# Patient Record
Sex: Female | Born: 1955 | Race: Black or African American | Hispanic: No | Smoking: Former smoker
Health system: Southern US, Community
[De-identification: ages and names within clinical notes are randomized; demographics above are authoritative.]

## PROBLEM LIST (undated history)

## (undated) DIAGNOSIS — G43909 Migraine, unspecified, not intractable, without status migrainosus: Secondary | ICD-10-CM

## (undated) DIAGNOSIS — I10 Essential (primary) hypertension: Secondary | ICD-10-CM

## (undated) DIAGNOSIS — M1990 Unspecified osteoarthritis, unspecified site: Secondary | ICD-10-CM

## (undated) DIAGNOSIS — E785 Hyperlipidemia, unspecified: Secondary | ICD-10-CM

## (undated) DIAGNOSIS — M109 Gout, unspecified: Secondary | ICD-10-CM

## (undated) DIAGNOSIS — F419 Anxiety disorder, unspecified: Secondary | ICD-10-CM

## (undated) DIAGNOSIS — E039 Hypothyroidism, unspecified: Secondary | ICD-10-CM

## (undated) DIAGNOSIS — D219 Benign neoplasm of connective and other soft tissue, unspecified: Secondary | ICD-10-CM

## (undated) DIAGNOSIS — K5900 Constipation, unspecified: Secondary | ICD-10-CM

## (undated) DIAGNOSIS — Z972 Presence of dental prosthetic device (complete) (partial): Secondary | ICD-10-CM

## (undated) DIAGNOSIS — K649 Unspecified hemorrhoids: Secondary | ICD-10-CM

## (undated) DIAGNOSIS — M719 Bursopathy, unspecified: Secondary | ICD-10-CM

## (undated) HISTORY — DX: Migraine, unspecified, not intractable, without status migrainosus: G43.909

## (undated) HISTORY — DX: Hyperlipidemia, unspecified: E78.5

## (undated) HISTORY — DX: Hypothyroidism, unspecified: E03.9

## (undated) HISTORY — DX: Gout, unspecified: M10.9

## (undated) HISTORY — PX: DG TEETH FULL: HXRAD118

## (undated) HISTORY — DX: Essential (primary) hypertension: I10

## (undated) HISTORY — DX: Unspecified hemorrhoids: K64.9

## (undated) HISTORY — DX: Unspecified osteoarthritis, unspecified site: M19.90

## (undated) HISTORY — PX: OTHER SURGICAL HISTORY: SHX169

## (undated) HISTORY — DX: Benign neoplasm of connective and other soft tissue, unspecified: D21.9

## (undated) HISTORY — DX: Constipation, unspecified: K59.00

---

## 2010-07-30 HISTORY — PX: LAPAROSCOPIC SUPRACERVICAL HYSTERECTOMY: SUR797

## 2012-01-29 ENCOUNTER — Ambulatory Visit: Payer: Self-pay | Admitting: Obstetrics and Gynecology

## 2012-01-29 LAB — COMPREHENSIVE METABOLIC PANEL
Anion Gap: 4 — ABNORMAL LOW (ref 7–16)
BUN: 10 mg/dL (ref 7–18)
Bilirubin,Total: 0.5 mg/dL (ref 0.2–1.0)
Calcium, Total: 8.9 mg/dL (ref 8.5–10.1)
Chloride: 107 mmol/L (ref 98–107)
Co2: 30 mmol/L (ref 21–32)
EGFR (African American): >60
Potassium: 3.8 mmol/L (ref 3.5–5.1)
SGOT(AST): 28 U/L (ref 15–37)
Total Protein: 8 g/dL (ref 6.4–8.2)

## 2012-01-29 LAB — CBC
HCT: 45.8 % (ref 35.0–47.0)
HGB: 15.1 g/dL (ref 12.0–16.0)
MCHC: 33 g/dL (ref 32.0–36.0)
MCV: 84 fL (ref 80–100)
RBC: 5.47 10*6/uL — ABNORMAL HIGH (ref 3.80–5.20)
RDW: 15.1 % — ABNORMAL HIGH (ref 11.5–14.5)

## 2012-02-08 ENCOUNTER — Ambulatory Visit: Payer: Self-pay | Admitting: Obstetrics and Gynecology

## 2012-02-09 LAB — HEMATOCRIT: HCT: 42.4 % (ref 35.0–47.0)

## 2012-02-12 LAB — PATHOLOGY REPORT

## 2012-07-21 ENCOUNTER — Ambulatory Visit: Payer: Self-pay | Admitting: Internal Medicine

## 2013-07-27 ENCOUNTER — Ambulatory Visit: Payer: Self-pay | Admitting: Nurse Practitioner

## 2014-08-03 ENCOUNTER — Ambulatory Visit: Payer: Self-pay | Admitting: Nurse Practitioner

## 2014-11-21 NOTE — Op Note (Signed)
PATIENT NAME:  Kristin, Greer MR#:  [id number] DATE OF BIRTH:  17-Apr-1956  DATE OF PROCEDURE:  02/08/2012  PREOPERATIVE DIAGNOSIS: Leiomyomatous uterus.   POSTOPERATIVE DIAGNOSIS: Leiomyomatous uterus.   PROCEDURE: Laparoscopic supracervical hysterectomy.   SURGEON: Will Bonnet, MD  ASSISTANT: Donzetta Matters, M.D.    ANESTHESIA: General.  ESTIMATED BLOOD LOSS: 50 mL.  OPERATIVE FLUIDS: 1200 mL crystalloid.   COMPLICATIONS: None.   FINDINGS:  1. Small uterus with multiple fibroids noted.  2. Absent bilateral fallopian tubes.  3. Normal appearing appendix.   SPECIMENS: Uterus without cervix.   CONDITION: Stable at the end of the procedure.   INDICATIONS FOR PROCEDURE: Kristin Greer is a 59 year old female who presented to the clinic with a long history of abdominal pain and pressure. Upon workup with a CT scan she did have noted multiple fibroids in her uterus. She presented to my clinic with a strong desire to have her uterus removed. After she was counseled regarding the potential for removing her uterus and having no relief from her pain she still readily agreed to the procedure. Prior to surgery she received preoperative clearance from her primary care physician. She was therefore taken to the Operating Room.   PROCEDURE IN DETAIL: The patient was met in the preoperative area and all of her questions were answered and the consent was reviewed. Next, she was taken to the Operating Room where she was placed under general anesthesia which was found to be adequate. She was placed in the dorsal supine lithotomy position and prepped and draped in the usual sterile fashion.   After a timeout was called, a sterile speculum was placed in the vagina and the Hulka tenaculum was affixed to the cervix and the speculum was removed. A Foley catheter was placed indwelling to the patient's bladder.   Attention was turned to the abdomen where a 5 mm infraumbilical incision was made after  injection of local anesthetic. The abdomen was entered using Opti-view trocar direct visualization method. Verification of abdominal entry was obtained using opening pressure. The abdomen was then insufflated with CO2 and the camera was introduced through the umbilical trocar and abdominal atraumatic entry was verified. A 10 mm left lower quadrant and 5 mm right lower quadrant ports were placed under direct intra-abdominal visualization. An abdominal surveillance was then undertaken with the above-noted findings.   The left round ligament was then transected using the Harmonic scalpel and the broad ligament was then dissected down to the level of the internal os and a bladder flap was created. The mesosalpinx was transected from lateral to medial and the utero-ovarian ligament was transected such that the Fallopian tubes would be removed and the ovary would be left in situ.  The posterior broad ligament was then transected down to the level of the internal os. The uterine arteries were then skeletonized and then cauterized using the bipolar electrocautery and then transected using the Harmonic scalpel. The same procedure was carried out on the right side of the uterus. Next, the Harmonic scalpel was used to transect the uterus from the cervix at the level of the internal os and the Hulka tenaculum was removed during this process. Once the uterus was liberalized from the cervix the bipolar electrocautery was used to obtain hemostasis at the cervical stump and the bipolar electrocautery was used to cauterize the endocervix.   The right lower quadrant port was replaced with the Gynecare morcellator port and then under direct visualization the Gynecare morcellator was then used  to morcellate the uterus and remove it entirely from the abdomen taking care to remove any pieces of uterus still remaining.    After verification that the uterus was removed the pedicles along with the cervical stump were then examined and  hemostasis was noted still. An antiadhesion barrier device was placed over the cervical stump. At this point the procedure was terminated. The abdomen was desufflated of CO2. Both lateral port sites removed under direct visualization and after the right lower quadrant port was closed using #0 Vicryl at the level of the fascia. The infraumbilical and the left lower quadrant port sites were closed using a subcuticular 3-0 Vicryl with Dermabond to close the skin. The right lower quadrant skin was closed using a 3-0 Vicryl in a subcuticular fashion and skin was reapproximated using Dermabond. Each site was injected with additional local anesthetic for postoperative pain control.   The cervix was inspected to verify that hemostasis was present after removal of Hulka clip and the vagina was inspected to verify that there were no sponges or instruments remaining.   Patient tolerated the procedure well. Sponge, lap, and needle counts were correct x2. The patient received preoperative antibiotics in the form of Ancef 2 grams. She received VTE prophylaxis in the form of pneumatic compression stockings that were in place and operating throughout the procedure.   ____________________________ Will Bonnet, MD sdj:cms D: 02/08/2012 19:40:08 ET T: 02/09/2012 08:48:35 ET JOB#: [id number]  cc: Will Bonnet, MD, <Dictator> Will Bonnet MD ELECTRONICALLY SIGNED 02/28/2012 19:53

## 2015-07-18 ENCOUNTER — Other Ambulatory Visit: Payer: Self-pay | Admitting: Nurse Practitioner

## 2015-07-18 DIAGNOSIS — Z1231 Encounter for screening mammogram for malignant neoplasm of breast: Secondary | ICD-10-CM

## 2015-08-05 ENCOUNTER — Ambulatory Visit: Payer: Self-pay

## 2015-08-23 ENCOUNTER — Ambulatory Visit
Admission: RE | Admit: 2015-08-23 | Discharge: 2015-08-23 | Disposition: A | Discharge status: Home / Self Care | Payer: Medicare HMO | Source: Ambulatory Visit | Attending: Nurse Practitioner | Admitting: Nurse Practitioner

## 2015-08-23 ENCOUNTER — Other Ambulatory Visit: Payer: Self-pay | Admitting: Nurse Practitioner

## 2015-08-23 DIAGNOSIS — Z1231 Encounter for screening mammogram for malignant neoplasm of breast: Secondary | ICD-10-CM | POA: Diagnosis present

## 2016-07-20 ENCOUNTER — Emergency Department
Admission: EM | Admit: 2016-07-20 | Discharge: 2016-07-20 | Disposition: A | Discharge status: Home / Self Care | Payer: Medicare HMO | Attending: Emergency Medicine | Admitting: Emergency Medicine

## 2016-07-20 ENCOUNTER — Emergency Department: Payer: Medicare HMO

## 2016-07-20 DIAGNOSIS — M19012 Primary osteoarthritis, left shoulder: Secondary | ICD-10-CM | POA: Diagnosis not present

## 2016-07-20 DIAGNOSIS — F419 Anxiety disorder, unspecified: Secondary | ICD-10-CM | POA: Insufficient documentation

## 2016-07-20 DIAGNOSIS — M25512 Pain in left shoulder: Secondary | ICD-10-CM | POA: Diagnosis present

## 2016-07-20 HISTORY — DX: Anxiety disorder, unspecified: F41.9

## 2016-07-20 MED ORDER — KETOROLAC TROMETHAMINE 60 MG/2ML IM SOLN
30.0000 mg | Freq: Once | INTRAMUSCULAR | Status: AC
  Administered 2016-07-20: 30 mg via INTRAMUSCULAR
  Filled 2016-07-20: qty 2

## 2016-07-20 MED ORDER — NAPROXEN 375 MG PO TABS: 375.0000 mg | ORAL_TABLET | Freq: Two times a day (BID) | ORAL | 0 refills | Status: AC

## 2016-07-20 NOTE — ED Triage Notes (Signed)
Pt states that she was dx with bursitis to left shoulder last week at Urgent Care. Given prednisone until finished and "got a shot". Pt states same pain back, "I need a shot". Hx of bursitis in both shoulders. Pt alert and oriented X4, active, cooperative, pt in NAD. RR even and unlabored, color WNL.

## 2016-07-20 NOTE — ED Provider Notes (Signed)
[facility] Emergency Department Provider Note   ____________________________________________   First MD Initiated Contact with Patient 07/20/16 1212     (approximate)  I have reviewed the triage vital signs and the nursing notes.   HISTORY  Chief Complaint Bursitis (left arm)    HPI Kristin Greer is a 60 y.o. female patient complaining of left shoulder pain History of trauma. Patient state evaluated and treated by orthopedics last week and given a sterile shot and her right shoulder. Patient states she is scheduled for physical therapy. Patient state the last 2 days she has increased pain in the left shoulder. Patient denies any provocative incident for her pain. Patient is right-hand dominant. Patient rates the pain as a 9/10. Patient described a pain as "achy". No palliative measures for this complaint.  Past Medical History:  Diagnosis Date  . Anxiety     There are no active problems to display for this patient.   History reviewed. No pertinent surgical history.  Prior to Admission medications   Medication Sig Start Date End Date Taking? Authorizing Provider  naproxen (NAPROSYN) 375 MG tablet Take 1 tablet (375 mg total) by mouth 2 (two) times daily with a meal. 07/20/16 07/20/17  Sable Feil, PA-C    Allergies Patient has no known allergies.  Family History  Problem Relation Age of Onset  . Breast cancer Sister 1    Social History Social History  Substance Use Topics  . Smoking status: Never Smoker  . Smokeless tobacco: Never Used  . Alcohol use No    Review of Systems Constitutional: No fever/chills Eyes: No visual changes. ENT: No sore throat. Cardiovascular: Denies chest pain. Respiratory: Denies shortness of breath. Gastrointestinal: No abdominal pain.  No nausea, no vomiting.  No diarrhea.  No constipation. Genitourinary: Negative for dysuria. Musculoskeletal: Left shoulder pain. Skin: Negative for rash. Neurological:  Negative for headaches, focal weakness or numbness. Psychiatric:Anxiety.  ____________________________________________   PHYSICAL EXAM:  VITAL SIGNS: ED Triage Vitals  Enc Vitals Group     BP 07/20/16 1151 119/86     Pulse Rate 07/20/16 1150 (!) 101     Resp 07/20/16 1150 16     Temp 07/20/16 1150 97.7 F (36.5 C)     Temp Source 07/20/16 1150 Oral     SpO2 07/20/16 1150 100 %     Weight 07/20/16 1150 110 lb (49.9 kg)     Height 07/20/16 1150 5\' 2"  (1.575 m)     Head Circumference --      Peak Flow --      Pain Score 07/20/16 1151 9     Pain Loc --      Pain Edu? --      Excl. in [place]? --     Constitutional: Alert and oriented. Well appearing and in no acute distress. Eyes: Conjunctivae are normal. PERRL. EOMI. Head: Atraumatic. Nose: No congestion/rhinnorhea. Mouth/Throat: Mucous membranes are moist.  Oropharynx non-erythematous. Neck: No stridor.  No cervical spine tenderness to palpation. Hematological/Lymphatic/Immunilogical: No cervical lymphadenopathy. Cardiovascular: Normal rate, regular rhythm. Grossly normal heart sounds.  Good peripheral circulation. Respiratory: Normal respiratory effort.  No retractions. Lungs CTAB. Gastrointestinal: Soft and nontender. No distention. No abdominal bruits. No CVA tenderness. Musculoskeletal: No obvious deformity of the left shoulder. No edema or erythema. Patient demonstrated moderate guarding palpation of the [place] joint. Patient decreased range of motion with adduction and overheated reach. Neurologic:  Normal speech and language. No gross focal neurologic deficits are appreciated. No  gait instability. Skin:  Skin is warm, dry and intact. No rash noted. Psychiatric: Mood and affect are normal. Speech and behavior are normal.  ____________________________________________   LABS (all labs ordered are listed, but only abnormal results are displayed)  Labs Reviewed - No data to  display ____________________________________________  EKG   ____________________________________________  RADIOLOGYMild arthritic changes but no acute other acute findings ____________________________________________   PROCEDURES  Procedure(s) performed: None  Procedures  Critical Care performed: No  ____________________________________________   INITIAL IMPRESSION / ASSESSMENT AND PLAN / ED COURSE  Pertinent labs & imaging results that were available during my care of the patient were reviewed by me and considered in my medical decision making (see chart for details).  Left shoulder pain bursitis. Patient given discharge care instructions. Patient given a prescription for meloxicam and advised to follow-up with [facility] to consider steroid injection to the left shoulder.  Clinical Course      ____________________________________________   FINAL CLINICAL IMPRESSION(S) / ED DIAGNOSES  Final diagnoses:  Primary osteoarthritis of left shoulder      NEW MEDICATIONS STARTED DURING THIS VISIT:  New Prescriptions   NAPROXEN (NAPROSYN) 375 MG TABLET    Take 1 tablet (375 mg total) by mouth 2 (two) times daily with a meal.     Note:  This document was prepared using Dragon voice recognition software and may include unintentional dictation errors.    Sable Feil, PA-C 07/20/16 [place], PA-C 07/20/16 1254    Lisa Roca, MD 07/20/16 [phone]

## 2016-07-20 NOTE — ED Notes (Signed)
Pt presents with bursitis to right arm; states she was given prednisone for same last week but it still hurts. Pt taking OTC meds with no relief. Pt alert & oriented with NAD noted.

## 2016-07-20 NOTE — Discharge Instructions (Signed)
Follow-up with treated orthopedic doctor to consider steroidal injection to the left shoulder.

## 2016-07-20 NOTE — ED Notes (Signed)
Pt discharged home after verbalizing understanding of discharge instructions; nad noted. 

## 2016-11-14 ENCOUNTER — Other Ambulatory Visit: Payer: Self-pay | Admitting: Nurse Practitioner

## 2016-11-14 DIAGNOSIS — Z1231 Encounter for screening mammogram for malignant neoplasm of breast: Secondary | ICD-10-CM

## 2016-12-12 ENCOUNTER — Ambulatory Visit
Admission: RE | Admit: 2016-12-12 | Discharge: 2016-12-12 | Disposition: A | Discharge status: Home / Self Care | Payer: Medicare HMO | Source: Ambulatory Visit | Attending: Nurse Practitioner | Admitting: Nurse Practitioner

## 2016-12-12 DIAGNOSIS — Z1231 Encounter for screening mammogram for malignant neoplasm of breast: Secondary | ICD-10-CM | POA: Diagnosis not present

## 2017-03-24 ENCOUNTER — Emergency Department
Admission: EM | Admit: 2017-03-24 | Discharge: 2017-03-24 | Disposition: A | Discharge status: Home / Self Care | Payer: Medicare HMO | Attending: Emergency Medicine | Admitting: Emergency Medicine

## 2017-03-24 ENCOUNTER — Emergency Department: Payer: Medicare HMO

## 2017-03-24 ENCOUNTER — Encounter: Payer: Self-pay | Admitting: Emergency Medicine

## 2017-03-24 DIAGNOSIS — Z79899 Other long term (current) drug therapy: Secondary | ICD-10-CM | POA: Insufficient documentation

## 2017-03-24 DIAGNOSIS — G8929 Other chronic pain: Secondary | ICD-10-CM | POA: Diagnosis not present

## 2017-03-24 DIAGNOSIS — M25512 Pain in left shoulder: Secondary | ICD-10-CM | POA: Insufficient documentation

## 2017-03-24 HISTORY — DX: Bursopathy, unspecified: M71.9

## 2017-03-24 MED ORDER — CYCLOBENZAPRINE HCL 5 MG PO TABS: 5.0000 mg | ORAL_TABLET | Freq: Three times a day (TID) | ORAL | 0 refills | Status: AC | PRN

## 2017-03-24 MED ORDER — TIZANIDINE HCL 4 MG PO TABS: 4.0000 mg | ORAL_TABLET | Freq: Three times a day (TID) | ORAL | 1 refills | Status: AC

## 2017-03-24 NOTE — ED Provider Notes (Signed)
[facility] Emergency Department Provider Note  ____________________________________________  Time seen: Approximately 6:47 PM  I have reviewed the triage vital signs and the nursing notes.   HISTORY  Chief Complaint Shoulder Pain and Bursitis    HPI Kristin Greer is a 61 y.o. female presenting to the emergency department with left shoulder pain that is chronic in nature for approximately 1 year. Patient states that she experiences an exacerbation of left shoulder pain approximately every 3 months. Patient states that Zanaflex and Flexeril work well for managing her chronic left shoulder pain. Patient states that she has an appointment to see her orthopedist on 04/10/2017. She denies radiculopathy, weakness or changes in sensation of the left upper extremity. Patient's symptoms are not exacerbated with range of motion at the neck. She denies falls or mechanisms of trauma. Patient rates that she recently completed a 5 day course of prednisone.   Past Medical History:  Diagnosis Date  . Anxiety   . Bursitis     There are no active problems to display for this patient.   History reviewed. No pertinent surgical history.  Prior to Admission medications   Medication Sig Start Date End Date Taking? Authorizing Provider  cyclobenzaprine (FLEXERIL) 5 MG tablet Take 1 tablet (5 mg total) by mouth 3 (three) times daily as needed for muscle spasms. 03/24/17 03/27/17  Lannie Fields, PA-C  naproxen (NAPROSYN) 375 MG tablet Take 1 tablet (375 mg total) by mouth 2 (two) times daily with a meal. 07/20/16 07/20/17  Sable Feil, PA-C  tiZANidine (ZANAFLEX) 4 MG tablet Take 1 tablet (4 mg total) by mouth 3 (three) times daily. 03/24/17 04/03/17  Lannie Fields, PA-C    Allergies Patient has no known allergies.  Family History  Problem Relation Age of Onset  . Breast cancer Sister 74    Social History Social History  Substance Use Topics  . Smoking status: Never  Smoker  . Smokeless tobacco: Never Used  . Alcohol use No     Review of Systems  Constitutional: No fever/chills Eyes: No visual changes. No discharge ENT: No upper respiratory complaints. Cardiovascular: no chest pain. Respiratory: no cough. No SOB. Musculoskeletal: Patient has left shoulder pain. Skin: Negative for rash, abrasions, lacerations, ecchymosis. Neurological: Negative for headaches, focal weakness or numbness.   ____________________________________________   PHYSICAL EXAM:  VITAL SIGNS: ED Triage Vitals [03/24/17 1704]  Enc Vitals Group     BP 130/65     Pulse Rate 90     Resp 16     Temp 98.1 F (36.7 C)     Temp Source Oral     SpO2 99 %     Weight      Height      Head Circumference      Peak Flow      Pain Score      Pain Loc      Pain Edu?      Excl. in [place]?      Constitutional: Alert and oriented. Well appearing and in no acute distress. Eyes: Conjunctivae are normal. PERRL. EOMI. Head: Atraumatic. Cardiovascular: Normal rate, regular rhythm. Normal S1 and S2.  Good peripheral circulation. Respiratory: Normal respiratory effort without tachypnea or retractions. Lungs CTAB. Good air entry to the bases with no decreased or absent breath sounds. Musculoskeletal: Patient has 5/5 strength in the upper and lower extremities bilaterally. Full range of motion at the shoulder, elbow and wrist bilaterally. No weakness was elicited with left  rotator cuff testing. Full range of motion at the hip, knee and ankle bilaterally. No changes in gait.  Neurologic:  Normal speech and language. No gross focal neurologic deficits are appreciated.  Skin:  Skin is warm, dry and intact. No rash noted. Psychiatric: Mood and affect are normal. Speech and behavior are normal. Patient exhibits appropriate insight and judgement.   ____________________________________________   LABS (all labs ordered are listed, but only abnormal results are displayed)  Labs Reviewed - No  data to display ____________________________________________  EKG   ____________________________________________  RADIOLOGY Unk Pinto, personally viewed and evaluated these images (plain radiographs) as part of my medical decision making, as well as reviewing the written report by the radiologist.  Dg Shoulder Left  Result Date: 03/24/2017 CLINICAL DATA:  Left shoulder pain common no known injury, initial encounter EXAM: LEFT SHOULDER - 2+ VIEW COMPARISON:  None. FINDINGS: There is no evidence of fracture or dislocation. No soft tissue abnormality is seen. Mild degenerative change the glenohumeral articulation is seen. IMPRESSION: No acute abnormality noted. Electronically Signed   By: Inez Catalina M.D.   On: 03/24/2017 18:07    ____________________________________________    PROCEDURES  Procedure(s) performed:    Procedures    Medications - No data to display   ____________________________________________   INITIAL IMPRESSION / ASSESSMENT AND PLAN / ED COURSE  Pertinent labs & imaging results that were available during my care of the patient were reviewed by me and considered in my medical decision making (see chart for details).  Review of the [place] CSRS was performed in accordance of the [place] prior to dispensing any controlled drugs.     Assessment and plan Left shoulder pain Patient presents to the emergency department with chronic left shoulder pain. X-ray examination revealed no acute bony abnormalities. Patient is requesting a refill on her Zanaflex and Flexeril. She was discharged with Zanaflex and Flexeril. Patient was advised to not take Zanaflex and Flexeril to same time. Patient was advised to follow-up with her orthopedist on 04/10/2017, which is her next scheduled appointment. Vital signs are reassuring prior to discharge. All patient questions were answered.   ____________________________________________  FINAL CLINICAL IMPRESSION(S) / ED  DIAGNOSES  Final diagnoses:  Chronic left shoulder pain      NEW MEDICATIONS STARTED DURING THIS VISIT:  New Prescriptions   CYCLOBENZAPRINE (FLEXERIL) 5 MG TABLET    Take 1 tablet (5 mg total) by mouth 3 (three) times daily as needed for muscle spasms.   TIZANIDINE (ZANAFLEX) 4 MG TABLET    Take 1 tablet (4 mg total) by mouth 3 (three) times daily.        This chart was dictated using voice recognition software/Dragon. Despite best efforts to proofread, errors can occur which can change the meaning. Any change was purely unintentional.    [place], [place], PA-C 03/24/17 Eduard Clos, [place], MD 03/24/17 [phone]

## 2017-03-24 NOTE — ED Triage Notes (Addendum)
Pt presents via POV with c/o left shoulder and arm pain for the past week related to her bursitis. Pt states she has bursitis and received a steroid shot last week. Pt has been taking zanaflex and flexeril PRN which have been prescribed in the past year.

## 2017-03-24 NOTE — ED Notes (Signed)
Pt c/o bursitis to L shoulder, was given steroid shot and course of prednisone last week. Has also been taking zanaflex and flexeril but has run out. Pt requesting refill of flexeril and zanaflex until 04/10/17 when she can see her PCP again.

## 2017-10-22 ENCOUNTER — Other Ambulatory Visit: Payer: Self-pay | Admitting: Nurse Practitioner

## 2017-10-22 DIAGNOSIS — Z1231 Encounter for screening mammogram for malignant neoplasm of breast: Secondary | ICD-10-CM

## 2017-11-13 ENCOUNTER — Telehealth: Payer: Self-pay | Admitting: Obstetrics & Gynecology

## 2017-11-13 NOTE — Telephone Encounter (Signed)
[facility] referring for HX of multiple fibroids and hot flashes not controlled.. Unable to leave voicemail due to mail box full

## 2017-11-14 NOTE — Telephone Encounter (Signed)
Called and left voicemail for patient to call back.

## 2017-12-09 ENCOUNTER — Encounter: Payer: Self-pay | Admitting: Obstetrics and Gynecology

## 2017-12-09 ENCOUNTER — Ambulatory Visit (INDEPENDENT_AMBULATORY_CARE_PROVIDER_SITE_OTHER): Payer: Medicare HMO | Admitting: Obstetrics and Gynecology

## 2017-12-09 VITALS — BP 140/70 | HR 78 | Ht 65.0 in | Wt 150.0 lb

## 2017-12-09 DIAGNOSIS — R232 Flushing: Secondary | ICD-10-CM

## 2017-12-09 NOTE — Progress Notes (Signed)
Obstetrics & Gynecology Office Visit   Chief Complaint  Patient presents with  . Hot Flashes    referral  Referral from [place], Evern Bio, ANP  History of Present Illness: 62 y.o. G69P0010 female who presents in referral for the above from [place], ANP, at [facility] .  She reports hot flashes since last year.  She has hot flashes during the day and night.  Sometimes they wake her from sleep. Before last year she did not have any hot flashes.  She had no medication changes at around the time her hot flashes started.  She has tried gabapentin and clonidine.  She has also tried [employer] OTC.  None of these medications have helped.  No issues with bleeding since her hysterectomy (laparoscopic supracervical) in 2012. She has been experience overeating and has gained weight.  She feels like she has abdominal bloating.     Health Maintenance:  Colonoscopy: doesn't remember when, but was normal.  Pap smear: 2018, reportedly normal Mammogram: 11/2016: BiRads 1  Past Medical History:  Diagnosis Date  . Anxiety   . Arthritis   . Bursitis   . Constipation   . Fibroids   . Gout   . Hemorrhoids   . Hyperlipidemia   . Hypertension   . Hypothyroid   . Migraine     Past Surgical History:  Procedure Laterality Date  . DG TEETH FULL     all teeth pulled has partials  . LAPAROSCOPIC SUPRACERVICAL HYSTERECTOMY  2012  . turmors removed     one in [employer] yrs ago; 5-6 removed 2015 [facility]    Gynecologic History: No LMP recorded. Patient is postmenopausal.  Obstetric History: G1P0010  Family History  Problem Relation Age of Onset  . Breast cancer Sister 64  . Lupus Sister     Social History   Socioeconomic History  . Marital status: Divorced    Spouse name: Not on file  . Number of children: Not on file  . Years of education: Not on file  . Highest education level: Not on file  Occupational History  . Not on file  Social Needs    . Financial resource strain: Not on file  . Food insecurity:    Worry: Not on file    Inability: Not on file  . Transportation needs:    Medical: Not on file    Non-medical: Not on file  Tobacco Use  . Smoking status: Current Every Day Smoker    Packs/day: 0.50  . Smokeless tobacco: Never Used  Substance and Sexual Activity  . Alcohol use: Yes    Comment: rarely  . Drug use: Never  . Sexual activity: Not Currently    Birth control/protection: Post-menopausal  Lifestyle  . Physical activity:    Days per week: Not on file    Minutes per session: Not on file  . Stress: Not on file  Relationships  . Social connections:    Talks on phone: Not on file    Gets together: Not on file    Attends religious service: Not on file    Active member of club or organization: Not on file    Attends meetings of clubs or organizations: Not on file    Relationship status: Not on file  . Intimate partner violence:    Fear of current or ex partner: Not on file    Emotionally abused: Not on file    Physically abused: Not on file    Forced  sexual activity: Not on file  Other Topics Concern  . Not on file  Social History Narrative  . Not on file   Allergies: No Known Allergies  Prior to Admission medications   Medication Sig Start Date End Date Taking? Authorizing Provider  albuterol (PROVENTIL HFA) 108 (90 Base) MCG/ACT inhaler Inhale 2 puffs into the lungs every 6 (six) hours as needed.   Yes [provider]  aspirin EC 81 MG tablet Take 1 tablet by mouth daily.   Yes [provider]  atorvastatin (LIPITOR) 10 MG tablet Take 1 tablet by mouth daily.   Yes [provider]  benztropine (COGENTIN) 1 MG tablet Take 1 tablet by mouth daily. 12/04/17  Yes [provider]  cetirizine (ZYRTEC) 10 MG tablet Take 1 tablet by mouth daily.   Yes [provider]  Cholecalciferol (VITAMIN D-1000 MAX ST) 1000 units tablet Take 1 tablet by mouth once a week.   Yes  [provider]  diclofenac sodium (VOLTAREN) 1 % GEL Apply 1 application topically 2 (two) times daily.   Yes [provider]  fluPHENAZine (PROLIXIN) 5 MG tablet Take 1 tablet by mouth at bedtime. 09/26/17  Yes [provider]  fluPHENAZine decanoate (PROLIXIN) 25 MG/ML injection Inject 25 mg into the muscle every 14 (fourteen) days. 11/07/17  Yes [provider]  fluticasone (FLONASE) 50 MCG/ACT nasal spray Place 1 spray into both nostrils daily. 11/06/17  Yes [provider]  fluticasone (FLOVENT DISKUS) 50 MCG/BLIST diskus inhaler Inhale 1 Device into the lungs as needed.   Yes [provider]  gabapentin (NEURONTIN) 100 MG capsule Take 1 capsule by mouth daily. 12/04/17  Yes [provider]  hydrOXYzine (VISTARIL) 25 MG capsule Take 1 capsule by mouth 2 (two) times daily. 11/21/17  Yes [provider]  levothyroxine (SYNTHROID, LEVOTHROID) 50 MCG tablet Take 1 tablet by mouth daily. am 11/22/17  Yes [provider]  meloxicam (MOBIC) 7.5 MG tablet Take 1 tablet by mouth daily. 12/04/17  Yes [provider]  metFORMIN (GLUCOPHAGE) 500 MG tablet Take 1 tablet by mouth 2 (two) times daily. 11/21/17  Yes [provider]  montelukast (SINGULAIR) 10 MG tablet Take 1 tablet by mouth daily. 12/04/17  Yes [provider]  OLANZapine (ZYPREXA) 20 MG tablet Take 1 tablet by mouth at bedtime. 11/21/17  Yes [provider]  Omega-3 Fatty Acids (SM FISH OIL) 1000 MG CAPS Take 1 capsule by mouth 2 (two) times daily.   Yes [provider]  traZODone (DESYREL) 50 MG tablet Take 1 tablet by mouth 2 (two) times daily. 12/04/17  Yes [provider]    Review of Systems  Constitutional: Negative.   HENT: Negative.   Eyes: Negative.   Respiratory: Negative.   Cardiovascular: Negative.   Gastrointestinal: Negative.   Genitourinary: Negative.   Skin: Negative.   Neurological: Negative.     Endo/Heme/Allergies: Negative.   Psychiatric/Behavioral: Negative.     Physical Exam BP 140/70   Pulse 78   Ht 5\' 5"  (1.651 m)   Wt 150 lb (68 kg)   BMI 24.96 kg/m  No LMP recorded. Patient is postmenopausal. Physical Exam  Constitutional: She is oriented to person, place, and time. She appears well-developed and well-nourished. No distress.  HENT:  Head: Normocephalic and atraumatic.  Neck: Normal range of motion. Neck supple.  Cardiovascular: Normal rate and regular rhythm.  Pulmonary/Chest: Effort normal and breath sounds normal. No respiratory distress.  Abdominal: Soft. Bowel sounds  are normal. She exhibits distension (mild). She exhibits no mass. There is no tenderness. There is no rebound and no guarding.  Musculoskeletal: Normal range of motion. She exhibits no edema.  Neurological: She is alert and oriented to person, place, and time. No cranial nerve deficit.  Psychiatric: She has a normal mood and affect. Her behavior is normal. Judgment normal.   Assessment: 62 y.o. G1P0010 female here for  1. Hot flashes      Plan: Problem List Items Addressed This Visit    None    Visit Diagnoses    Hot flashes    -  Primary   Follicle stimulating hormone   US PELVIS TRANSVANGINAL NON-OB (TV ONLY)     We discussed that given her age, it is unusual to have the onset of hot flashes. I will check an [facility] and perform a pelvic ultrasound to assess her ovaries.  If normal, we discuss some treatment strategies for hot flashes. Given her smoking status, her age, and her hypertension, she is NOT a good candidate for estrogen.  It appears she has tried and failed gabapentin and clonidine. If her PCP believes it will be helpful, she could consider starting Brisdelle or an SNRI.  If she can not afford Brisdelle (paroxitine 7.5 mg), she could consider prescribing paroxitine 10 mg- which is a more standard and generic dose).  Alternatively, SNRI's have been studied and found to have moderate  success. However, I do not know whether any pre-existing psychiatric conditions preclude her from taking this medication. That is why I will recommend this for consideration from her PCP.   Her referral also mentions her having a fibroid uterus. However, she is status post laparoscopic supracervical hysterectomy several years ago by me. She is not having any pelvic pain and is up to date on her pap smears.  So,I do not have any recommendations regarding this issue.    We discussed WHI study findings in detail.  In the combined estrogen-progesterone arm breast cancer risk was increased by 1.26 (CI of 1.00 to 1.59), coronary heart disease 1.29 (CI 1.02-1.63), stroke risk 1.41 (1.07-1.85), and pulmonary embolism 2.13 (CI 1.39-3.25).  That being said the while statistically significant the actual number of cases attributable are relatively small at an addition 8 cases of breast cancer, 7 more coronary artery event, 8 more strokes, and 8 additional case of pulmonary embolism per 10,000 women.  Study was terminated because of the increased breast cancer risk, this was not seen in the progestin only arm of the study for women without an intact uterus.  In addition it is important to note that HRT also had positive or risk reducing effects, and all cause mortality between the HRT/non-HRT users is not statistically different.  Estrogen-progestin HRT decreased the relative risk of hip fracture 0.66 (CI 0.45-0.98), colorectal cancer 0.63 (0.43-0.92).  Current consensus is to limit dose to the lowest effective dose, and shortest treatment duration possible.  Breast cancer risk appeared to increase after 4 years of use.  Also important to note is that these risk refer to systemic HRT for the treatment of vasomotor symptoms, and do not apply to vaginal preperations with minimal systemic absorption and aimed at treating symptoms of vulvovaginal atrophy.    We briefly touched on findings of WHIMS trial published in 2005  which looked at women 53 year of age or older, and whether HRT was protective against the development of dementia.  The study revealed that HRT actually increased the risk for  the development of dementia but was limited by looking only at patients 54 years of age and older.  The subsequent KEEPS trial  In 2012 which looked at HRT in recently postmenopausal women did not show any improvement in cognitive function for women on HRT.  However, there was also no significant cognitive declines seen in recently postmenopausal women receiving HRT as had previously been shown in the [facility] trial.    30 minutes spent in face to face discussion with > 50% spent in counseling,management, and coordination of care of her menopausal symptoms.   Return in about 1 week (around 12/16/2017) for schedule pelvic u/s and follow up Dr. Glennon Mac.   Prentice Docker, MD 12/09/2017 6:03 PM     CC: Evern Bio, [place], [place], [place], [place] [address]

## 2017-12-10 LAB — FOLLICLE STIMULATING HORMONE: FSH: 55.8 m[IU]/mL

## 2017-12-13 ENCOUNTER — Ambulatory Visit
Admission: RE | Admit: 2017-12-13 | Discharge: 2017-12-13 | Disposition: A | Discharge status: Home / Self Care | Payer: Medicare HMO | Source: Ambulatory Visit | Attending: Nurse Practitioner | Admitting: Nurse Practitioner

## 2017-12-13 DIAGNOSIS — Z1231 Encounter for screening mammogram for malignant neoplasm of breast: Secondary | ICD-10-CM | POA: Insufficient documentation

## 2017-12-17 ENCOUNTER — Encounter: Payer: Self-pay | Admitting: Obstetrics and Gynecology

## 2017-12-17 ENCOUNTER — Ambulatory Visit (INDEPENDENT_AMBULATORY_CARE_PROVIDER_SITE_OTHER): Payer: Medicare HMO | Admitting: Obstetrics and Gynecology

## 2017-12-17 ENCOUNTER — Ambulatory Visit (INDEPENDENT_AMBULATORY_CARE_PROVIDER_SITE_OTHER): Payer: Medicare HMO

## 2017-12-17 DIAGNOSIS — R232 Flushing: Secondary | ICD-10-CM

## 2017-12-17 NOTE — Progress Notes (Signed)
Gynecology Ultrasound Follow Up   Chief Complaint  Patient presents with  . Follow-up    GYN U/S  Follow up hot flashes   History of Present Illness: Patient is a 62 y.o. female who presents today for ultrasound evaluation of the above .  Ultrasound demonstrates the following findings Adnexa: no masses seen  Uterus: surgically absent with cervix present  Additional: no free fluid seen in pelvis.   Past Medical History:  Diagnosis Date  . Anxiety   . Arthritis   . Bursitis   . Constipation   . Fibroids   . Gout   . Hemorrhoids   . Hyperlipidemia   . Hypertension   . Hypothyroid   . Migraine     Past Surgical History:  Procedure Laterality Date  . DG TEETH FULL     all teeth pulled has partials  . LAPAROSCOPIC SUPRACERVICAL HYSTERECTOMY  2012  . turmors removed     one in [employer] yrs ago; 5-6 removed 2015 [place]   Family History  Problem Relation Age of Onset  . Breast cancer Sister 23  . Lupus Sister     Social History   Socioeconomic History  . Marital status: Divorced    Spouse name: Not on file  . Number of children: Not on file  . Years of education: Not on file  . Highest education level: Not on file  Occupational History  . Not on file  Social Needs  . Financial resource strain: Not on file  . Food insecurity:    Worry: Not on file    Inability: Not on file  . Transportation needs:    Medical: Not on file    Non-medical: Not on file  Tobacco Use  . Smoking status: Current Every Day Smoker    Packs/day: 0.50  . Smokeless tobacco: Never Used  Substance and Sexual Activity  . Alcohol use: Yes    Comment: rarely  . Drug use: Never  . Sexual activity: Not Currently    Birth control/protection: Post-menopausal  Lifestyle  . Physical activity:    Days per week: Not on file    Minutes per session: Not on file  . Stress: Not on file  Relationships  . Social connections:    Talks on phone: Not on file    Gets together: Not on file      Attends religious service: Not on file    Active member of club or organization: Not on file    Attends meetings of clubs or organizations: Not on file    Relationship status: Not on file  . Intimate partner violence:    Fear of current or ex partner: Not on file    Emotionally abused: Not on file    Physically abused: Not on file    Forced sexual activity: Not on file  Other Topics Concern  . Not on file  Social History Narrative  . Not on file   Allergies: No Known Allergies  Prior to Admission medications   Medication Sig Start Date End Date Taking? Authorizing Provider  albuterol (PROVENTIL HFA) 108 (90 Base) MCG/ACT inhaler Inhale 2 puffs into the lungs every 6 (six) hours as needed.   Yes [provider]  aspirin EC 81 MG tablet Take 1 tablet by mouth daily.   Yes [provider]  atorvastatin (LIPITOR) 10 MG tablet Take 1 tablet by mouth daily.   Yes [provider]  benztropine (COGENTIN) 1 MG tablet Take 1 tablet  by mouth daily. 12/04/17  Yes [provider]  cetirizine (ZYRTEC) 10 MG tablet Take 1 tablet by mouth daily.   Yes [provider]  Cholecalciferol (VITAMIN D-1000 MAX ST) 1000 units tablet Take 1 tablet by mouth once a week.   Yes [provider]  diclofenac sodium (VOLTAREN) 1 % GEL Apply 1 application topically 2 (two) times daily.   Yes [provider]  fluPHENAZine (PROLIXIN) 5 MG tablet Take 1 tablet by mouth at bedtime. 09/26/17  Yes [provider]  fluPHENAZine decanoate (PROLIXIN) 25 MG/ML injection Inject 25 mg into the muscle every 14 (fourteen) days. 11/07/17  Yes [provider]  fluticasone (FLONASE) 50 MCG/ACT nasal spray Place 1 spray into both nostrils daily. 11/06/17  Yes [provider]  fluticasone (FLOVENT DISKUS) 50 MCG/BLIST diskus inhaler Inhale 1 Device into the lungs as needed.   Yes [provider]  gabapentin (NEURONTIN) 100 MG capsule Take 1  capsule by mouth daily. 12/04/17  Yes [provider]  hydrOXYzine (VISTARIL) 25 MG capsule Take 1 capsule by mouth 2 (two) times daily. 11/21/17  Yes [provider]  levothyroxine (SYNTHROID, LEVOTHROID) 50 MCG tablet Take 1 tablet by mouth daily. am 11/22/17  Yes [provider]  meloxicam (MOBIC) 7.5 MG tablet Take 1 tablet by mouth daily. 12/04/17  Yes [provider]  metFORMIN (GLUCOPHAGE) 500 MG tablet Take 1 tablet by mouth 2 (two) times daily. 11/21/17  Yes [provider]  montelukast (SINGULAIR) 10 MG tablet Take 1 tablet by mouth daily. 12/04/17  Yes [provider]  OLANZapine (ZYPREXA) 20 MG tablet Take 1 tablet by mouth at bedtime. 11/21/17  Yes [provider]  Omega-3 Fatty Acids (SM FISH OIL) 1000 MG CAPS Take 1 capsule by mouth 2 (two) times daily.   Yes [provider]  traZODone (DESYREL) 50 MG tablet Take 1 tablet by mouth 2 (two) times daily. 12/04/17  Yes [provider]   Physical Exam BP 134/80 (BP Location: Left Arm, Patient Position: Sitting, Cuff Size: Normal)   Pulse 61   Ht '5\' 5"'$  (1.651 m)   Wt 152 lb (68.9 kg)   BMI 25.29 kg/m    General: NAD HEENT: normocephalic, anicteric Pulmonary: No increased work of breathing Extremities: no edema, erythema, or tenderness Neurologic: Grossly intact, normal gait Psychiatric: mood appropriate, affect full  US Pelvis Transvanginal Non-ob (tv Only)  Result Date: 12/17/2017 Patient Name: Kristin Greer DOB: [birth year] MRN: [id number] ULTRASOUND REPORT Location: [facility] Date of Service: 12/17/2017 Indications:HOT FLASHES Findings: The uterus is surgically absent. Right Ovary measures 2.15 x 2.56 x 1.45 cm. It is normal in appearance. Left Ovary measures 1.68 x 1.22 x 1.04 cm. It is normal in appearance. Survey of the adnexa demonstrates no adnexal masses. There is no free fluid in the cul de sac. Impression: 1. Supracervical hysterectomy. 2.  Abnormalities are not seen. Recommendations: 1.Clinical correlation with the patient's History and Physical Exam. Mital bahen Marlowe Sax, RDMS The ultrasound images and findings were reviewed by me and I agree with the above report. Prentice Docker, MD, [facility], [facility] 12/17/2017 2:56 PM     Assessment: 62 y.o. G1P0010 here for  1. Hot flashes      Plan: Problem List Items Addressed This Visit      Cardiovascular and Mediastinum   Hot flashes (Chronic)     Her FSH from 5/13 was 55.8 mIU/mL.  This is consistent with menopause.  We discussed that she has only a few options for hot flashes.  We discussed Brisdelle, which is a paroxetine 7.5 mg taken nightly.  I have substituted a generic 10 mg tablet instead.  If she could tolerate an SNRI, given her other medications, this might also be reasonable to try. However, given the myriad medications she is taking, I would defer to her PCP to decide whether any of these medications are a reasonable idea to try for her.  Otherwise, I would continue to monitor for issues with the thyroid and adrenal glands, given her late age at the onset of hot flashes.    20 minutes spent in face to face discussion with > 50% spent in counseling,management, and coordination of care of her hot flashes.  Follow up, as needed.  Prentice Docker, MD, [facility], [facility] 12/17/2017 9:33 PM    CC:  Evern Bio, [place], [place], [place] [address] [place], [place] [address]

## 2018-09-09 ENCOUNTER — Other Ambulatory Visit: Payer: Self-pay | Admitting: Nurse Practitioner

## 2018-09-09 DIAGNOSIS — Z1231 Encounter for screening mammogram for malignant neoplasm of breast: Secondary | ICD-10-CM

## 2018-12-15 ENCOUNTER — Other Ambulatory Visit: Payer: Self-pay

## 2018-12-15 ENCOUNTER — Ambulatory Visit
Admission: RE | Admit: 2018-12-15 | Discharge: 2018-12-15 | Disposition: A | Discharge status: Home / Self Care | Payer: Medicare HMO | Source: Ambulatory Visit | Attending: Nurse Practitioner | Admitting: Nurse Practitioner

## 2018-12-15 DIAGNOSIS — Z1231 Encounter for screening mammogram for malignant neoplasm of breast: Secondary | ICD-10-CM | POA: Insufficient documentation

## 2019-08-27 ENCOUNTER — Encounter: Payer: Self-pay | Admitting: Obstetrics and Gynecology

## 2019-08-27 ENCOUNTER — Ambulatory Visit (INDEPENDENT_AMBULATORY_CARE_PROVIDER_SITE_OTHER): Payer: Medicare Other | Admitting: Obstetrics and Gynecology

## 2019-08-27 ENCOUNTER — Other Ambulatory Visit: Payer: Self-pay

## 2019-08-27 VITALS — BP 128/76 | HR 81 | Ht 65.0 in | Wt 148.0 lb

## 2019-08-27 DIAGNOSIS — N951 Menopausal and female climacteric states: Secondary | ICD-10-CM

## 2019-08-27 MED ORDER — PAROXETINE MESYLATE 7.5 MG PO CAPS: 7.5000 mg | ORAL_CAPSULE | Freq: Every day | ORAL | 1 refills | Status: DC

## 2019-08-27 NOTE — Progress Notes (Signed)
Patient comes in today for new patient appointment. Se is wanting to discuss treatment for hot flashes.

## 2019-08-27 NOTE — Progress Notes (Signed)
HPI:      [name] is a 64 y.o. G1P0010 who LMP was No LMP recorded. Patient is postmenopausal.  Subjective:   She presents today as a new patient referred from her primary care physician.  Patient complains of daily hot flashes and daily night sweats.  She says these seem to be worse in the last few months. Patient is on a number of medications 1 of which is gabapentin which is also occasionally used for nonhormonal treatment of hot flashes.    Hx: The following portions of the patient's history were reviewed and updated as appropriate:             She  has a past medical history of Anxiety, Arthritis, Bursitis, Constipation, Fibroids, Gout, Hemorrhoids, Hyperlipidemia, Hypertension, Hypothyroid, and Migraine. She does not have any pertinent problems on file. She  has a past surgical history that includes turmors removed; DG TEETH FULL; and Laparoscopic supracervical hysterectomy (2012). Her family history includes Breast cancer (age of onset: 37) in her sister; Lupus in her sister. She  reports that she has quit smoking. She smoked 0.50 packs per day. She has never used smokeless tobacco. She reports current alcohol use. She reports that she does not use drugs. She has a current medication list which includes the following prescription(s): albuterol, aspirin ec, atorvastatin, benztropine, cetirizine, cholecalciferol, diclofenac sodium, fluphenazine, fluphenazine decanoate, fluticasone, fluticasone, gabapentin, hydrochlorothiazide, hydroxyzine, levothyroxine, meloxicam, metformin, montelukast, olanzapine, sm fish oil, tizanidine, trazodone, and paroxetine mesylate. She has No Known Allergies.       Review of Systems:  Review of Systems  Constitutional: Denied constitutional symptoms, night sweats, recent illness, fatigue, fever, insomnia and weight loss.  Eyes: Denied eye symptoms, eye pain, photophobia, vision change and visual disturbance.  Ears/Nose/Throat/Neck: Denied ear, nose,  throat or neck symptoms, hearing loss, nasal discharge, sinus congestion and sore throat.  Cardiovascular: Denied cardiovascular symptoms, arrhythmia, chest pain/pressure, edema, exercise intolerance, orthopnea and palpitations.  Respiratory: Denied pulmonary symptoms, asthma, pleuritic pain, productive sputum, cough, dyspnea and wheezing.  Gastrointestinal: Denied, gastro-esophageal reflux, melena, nausea and vomiting.  Genitourinary: Denied genitourinary symptoms including symptomatic vaginal discharge, pelvic relaxation issues, and urinary complaints.  Musculoskeletal: Denied musculoskeletal symptoms, stiffness, swelling, muscle weakness and myalgia.  Dermatologic: Denied dermatology symptoms, rash and scar.  Neurologic: Denied neurology symptoms, dizziness, headache, neck pain and syncope.  Psychiatric: Denied psychiatric symptoms, anxiety and depression.  Endocrine: See HPI for additional information.   Meds:   Current Outpatient Medications on File Prior to Visit  Medication Sig Dispense Refill  . albuterol (PROVENTIL HFA) 108 (90 Base) MCG/ACT inhaler Inhale 2 puffs into the lungs every 6 (six) hours as needed.    Marland Kitchen aspirin EC 81 MG tablet Take 1 tablet by mouth daily.    Marland Kitchen atorvastatin (LIPITOR) 10 MG tablet Take 1 tablet by mouth daily.    . benztropine (COGENTIN) 1 MG tablet Take 1 tablet by mouth daily.    . cetirizine (ZYRTEC) 10 MG tablet Take 1 tablet by mouth daily.    . Cholecalciferol (VITAMIN D-1000 MAX ST) 1000 units tablet Take 1 tablet by mouth once a week.    . diclofenac sodium (VOLTAREN) 1 % GEL Apply 1 application topically 2 (two) times daily.    . fluPHENAZine (PROLIXIN) 5 MG tablet Take 1 tablet by mouth at bedtime.    . fluPHENAZine decanoate (PROLIXIN) 25 MG/ML injection Inject 25 mg into the muscle every 14 (fourteen) days.    . fluticasone (FLONASE) 50 MCG/ACT  nasal spray Place 1 spray into both nostrils daily.    . fluticasone (FLOVENT DISKUS) 50 MCG/BLIST  diskus inhaler Inhale 1 Device into the lungs as needed.    . gabapentin (NEURONTIN) 100 MG capsule Take 1 capsule by mouth daily.    . hydrochlorothiazide (HYDRODIURIL) 12.5 MG tablet hydroCHLOROthiazide 12.5 MG Oral Tablet QTY: 0 tablet Days: 0 Refills: 0  Written: 06/04/18 Patient Instructions: qd    . hydrOXYzine (VISTARIL) 25 MG capsule Take 1 capsule by mouth 2 (two) times daily.    Marland Kitchen levothyroxine (SYNTHROID, LEVOTHROID) 50 MCG tablet Take 1 tablet by mouth daily. am    . meloxicam (MOBIC) 7.5 MG tablet Take 1 tablet by mouth daily.    . metFORMIN (GLUCOPHAGE) 500 MG tablet Take 1 tablet by mouth 2 (two) times daily.    . montelukast (SINGULAIR) 10 MG tablet Take 1 tablet by mouth daily.    Marland Kitchen OLANZapine (ZYPREXA) 20 MG tablet Take 1 tablet by mouth at bedtime.    . Omega-3 Fatty Acids (SM FISH OIL) 1000 MG CAPS Take 1 capsule by mouth 2 (two) times daily.    Marland Kitchen tiZANidine (ZANAFLEX) 4 MG tablet tiZANidine HCl 4 MG Oral Tablet QTY: 0 tablet Days: 0 Refills: 0  Written: 06/04/18 Patient Instructions: tid    . traZODone (DESYREL) 50 MG tablet Take 1 tablet by mouth 2 (two) times daily.     No current facility-administered medications on file prior to visit.    Objective:     Vitals:   08/27/19 0943  BP: 128/76  Pulse: 81                Assessment:    G1P0010 Patient Active Problem List   Diagnosis Date Noted  . Hot flashes 12/17/2017     1. Symptomatic menopausal or female climacteric states     Patient had somewhat increased risk for complications and a decrease benefit for use of hormonal treatment of hot flashes and night sweats.  I do not recommend hormonal forms of treatment after the age of 34 or if the patient has been in menopause for greater than 10 years. (See women's health initiative study and subsequent follow-up literature)   Plan:            1.  We have discussed multiple methods of treatment for hot flashes that are nonhormonal.  I have specifically  spoken to her about black cohosh which is an over-the-counter medication.  I have informed her that she is currently using one of the excepted treatments for hot flashes-gabapentin-but she says this is obviously not working.  We will try a low-dose use of Brisdelle nightly to see if this has any effect for her.  We spent quite a while discussing the signs and symptoms of menopause and why hormones are not appropriate in this case as well as multiple nonhormonal strategies for combating hot flashes.  Orders No orders of the defined types were placed in this encounter.    Meds ordered this encounter  Medications  . PARoxetine Mesylate 7.5 MG CAPS    Sig: Take 7.5 mg by mouth at bedtime.    Dispense:  45 capsule    Refill:  1      F/U  No follow-ups on file. I spent 31 minutes involved in the care of this patient preparing to see the patient by obtaining and reviewing her medical history (including labs, imaging tests and prior procedures), documenting clinical information in the electronic health record (  EHR), counseling and coordinating care plans, writing and sending prescriptions, ordering tests or procedures and directly communicating with the patient by discussing pertinent items from her history and physical exam as well as detailing my assessment and plan as noted above so that she has an informed understanding.  All of her questions were answered.  Finis Bud, M.D. 08/27/2019 10:16 AM

## 2019-08-31 ENCOUNTER — Telehealth: Payer: Self-pay

## 2019-09-04 NOTE — Telephone Encounter (Signed)
Mrs. Kristin Greer prescription auth request through [facility].. continues to go be received by [facility]. The clinic called to makes [place] aware. Pt has called and really needs her prescription. [facility] forwarded req to our office. Please help patient

## 2019-09-08 NOTE — Telephone Encounter (Signed)
I tried to do prior authorization and it would not go through. I called pharmacy and they stated that it did not need PA and it has already went through.

## 2019-10-08 ENCOUNTER — Encounter: Payer: Self-pay | Admitting: Obstetrics and Gynecology

## 2019-10-08 ENCOUNTER — Telehealth: Payer: Self-pay | Admitting: Obstetrics and Gynecology

## 2019-10-08 ENCOUNTER — Other Ambulatory Visit: Payer: Self-pay

## 2019-10-08 ENCOUNTER — Ambulatory Visit (INDEPENDENT_AMBULATORY_CARE_PROVIDER_SITE_OTHER): Payer: Medicare HMO | Admitting: Obstetrics and Gynecology

## 2019-10-08 VITALS — BP 157/78 | HR 82 | Ht 64.0 in | Wt 153.0 lb

## 2019-10-08 DIAGNOSIS — N951 Menopausal and female climacteric states: Secondary | ICD-10-CM

## 2019-10-08 DIAGNOSIS — R140 Abdominal distension (gaseous): Secondary | ICD-10-CM

## 2019-10-08 MED ORDER — PAROXETINE MESYLATE 7.5 MG PO CAPS: 7.5000 mg | ORAL_CAPSULE | Freq: Every day | ORAL | 2 refills | Status: AC

## 2019-10-08 NOTE — Progress Notes (Signed)
HPI:      [name] is a 64 y.o. G1P0010 who LMP was No LMP recorded. Patient is postmenopausal.  Subjective:   She presents today for follow-up after beginning paroxetine for significant hot flashes/menopausal symptoms.  Patient had previously tried gabapentin.  She is ineligible for HRT because of her age and time in menopause without previous HRT. She reports that she has used the paroxetine and is working well for her.  She is very happy on this medicine and reports that her hot flashes have completely resolved.  She would like to continue the medicine. She does complain of abdominal bloating and that her clothes are no longer fitting correctly.  Reports no pelvic pain.  She states that she had a history of "pelvic tumors" in the past but she is unable to describe them any further.  She is a poor historian.    Hx: The following portions of the patient's history were reviewed and updated as appropriate:             She  has a past medical history of Anxiety, Arthritis, Bursitis, Constipation, Fibroids, Gout, Hemorrhoids, Hyperlipidemia, Hypertension, Hypothyroid, and Migraine. She does not have any pertinent problems on file. She  has a past surgical history that includes turmors removed; DG TEETH FULL; and Laparoscopic supracervical hysterectomy (2012). Her family history includes Breast cancer (age of onset: 48) in her sister; Lupus in her sister. She  reports that she has quit smoking. She smoked 0.50 packs per day. She has never used smokeless tobacco. She reports current alcohol use. She reports that she does not use drugs. She has a current medication list which includes the following prescription(s): albuterol, aspirin ec, atorvastatin, benztropine, cetirizine, cholecalciferol, diclofenac sodium, fluphenazine, fluphenazine decanoate, fluticasone, fluticasone, gabapentin, hydrochlorothiazide, hydroxyzine, levothyroxine, meloxicam, metformin, montelukast, olanzapine, sm fish oil,  paroxetine mesylate, tizanidine, and trazodone. She has No Known Allergies.       Review of Systems:  Review of Systems  Constitutional: Denied constitutional symptoms, night sweats, recent illness, fatigue, fever, insomnia and weight loss.  Eyes: Denied eye symptoms, eye pain, photophobia, vision change and visual disturbance.  Ears/Nose/Throat/Neck: Denied ear, nose, throat or neck symptoms, hearing loss, nasal discharge, sinus congestion and sore throat.  Cardiovascular: Denied cardiovascular symptoms, arrhythmia, chest pain/pressure, edema, exercise intolerance, orthopnea and palpitations.  Respiratory: Denied pulmonary symptoms, asthma, pleuritic pain, productive sputum, cough, dyspnea and wheezing.  Gastrointestinal: Denied, gastro-esophageal reflux, melena, nausea and vomiting.  Genitourinary: See HPI for additional information.  Musculoskeletal: Denied musculoskeletal symptoms, stiffness, swelling, muscle weakness and myalgia.  Dermatologic: Denied dermatology symptoms, rash and scar.  Neurologic: Denied neurology symptoms, dizziness, headache, neck pain and syncope.  Psychiatric: Denied psychiatric symptoms, anxiety and depression.  Endocrine: Denied endocrine symptoms including hot flashes and night sweats.   Meds:   Current Outpatient Medications on File Prior to Visit  Medication Sig Dispense Refill  . albuterol (PROVENTIL HFA) 108 (90 Base) MCG/ACT inhaler Inhale 2 puffs into the lungs every 6 (six) hours as needed.    Marland Kitchen aspirin EC 81 MG tablet Take 1 tablet by mouth daily.    Marland Kitchen atorvastatin (LIPITOR) 10 MG tablet Take 1 tablet by mouth daily.    . benztropine (COGENTIN) 1 MG tablet Take 1 tablet by mouth daily.    . cetirizine (ZYRTEC) 10 MG tablet Take 1 tablet by mouth daily.    . Cholecalciferol (VITAMIN D-1000 MAX ST) 1000 units tablet Take 1 tablet by mouth once a week.    Marland Kitchen  diclofenac sodium (VOLTAREN) 1 % GEL Apply 1 application topically 2 (two) times daily.    .  fluPHENAZine (PROLIXIN) 5 MG tablet Take 1 tablet by mouth at bedtime.    . fluPHENAZine decanoate (PROLIXIN) 25 MG/ML injection Inject 25 mg into the muscle every 14 (fourteen) days.    . fluticasone (FLONASE) 50 MCG/ACT nasal spray Place 1 spray into both nostrils daily.    . fluticasone (FLOVENT DISKUS) 50 MCG/BLIST diskus inhaler Inhale 1 Device into the lungs as needed.    . gabapentin (NEURONTIN) 100 MG capsule Take 1 capsule by mouth daily.    . hydrochlorothiazide (HYDRODIURIL) 12.5 MG tablet hydroCHLOROthiazide 12.5 MG Oral Tablet QTY: 0 tablet Days: 0 Refills: 0  Written: 06/04/18 Patient Instructions: qd    . hydrOXYzine (VISTARIL) 25 MG capsule Take 1 capsule by mouth 2 (two) times daily.    Marland Kitchen levothyroxine (SYNTHROID, LEVOTHROID) 50 MCG tablet Take 1 tablet by mouth daily. am    . meloxicam (MOBIC) 7.5 MG tablet Take 1 tablet by mouth daily.    . metFORMIN (GLUCOPHAGE) 500 MG tablet Take 1 tablet by mouth 2 (two) times daily.    . montelukast (SINGULAIR) 10 MG tablet Take 1 tablet by mouth daily.    Marland Kitchen OLANZapine (ZYPREXA) 20 MG tablet Take 1 tablet by mouth at bedtime.    . Omega-3 Fatty Acids (SM FISH OIL) 1000 MG CAPS Take 1 capsule by mouth 2 (two) times daily.    Marland Kitchen tiZANidine (ZANAFLEX) 4 MG tablet tiZANidine HCl 4 MG Oral Tablet QTY: 0 tablet Days: 0 Refills: 0  Written: 06/04/18 Patient Instructions: tid    . traZODone (DESYREL) 50 MG tablet Take 1 tablet by mouth 2 (two) times daily.     No current facility-administered medications on file prior to visit.    Objective:     Vitals:   10/08/19 0955  BP: (!) 157/78  Pulse: 82              Abdominal examination reveals no obvious fluid wave, no masses are palpated.  Does appear to be some protuberance of the lower abdomen.  Assessment:    G1P0010 Patient Active Problem List   Diagnosis Date Noted  . Hot flashes 12/17/2017     1. Symptomatic menopausal or female climacteric states   2. Abdominal bloating      Patient doing very well on paroxetine for hot flashes and menopausal symptoms.  She would like to continue   Plan:            1.  Continue paroxetine  2.  Ultrasound for new onset pelvic bloating Orders Orders Placed This Encounter  Procedures  . US PELVIS (TRANSABDOMINAL ONLY)     Meds ordered this encounter  Medications  . PARoxetine Mesylate 7.5 MG CAPS    Sig: Take 7.5 mg by mouth at bedtime.    Dispense:  90 capsule    Refill:  2      F/U  No follow-ups on file. I spent 22 minutes involved in the care of this patient preparing to see the patient by obtaining and reviewing her medical history (including labs, imaging tests and prior procedures), documenting clinical information in the electronic health record (EHR), counseling and coordinating care plans, writing and sending prescriptions, ordering tests or procedures and directly communicating with the patient by discussing pertinent items from her history and physical exam as well as detailing my assessment and plan as noted above so that she has an informed  understanding.  All of her questions were answered.  Finis Bud, M.D. 10/08/2019 10:17 AM

## 2019-10-08 NOTE — Telephone Encounter (Signed)
Called pt couldn't leave message. Her appt on 3/16 needs to be moved u/s isnt here. Moved to the 3/23 at 3:00

## 2019-10-09 MED ORDER — PAROXETINE MESYLATE 10 MG PO TABS: 10.0000 mg | ORAL_TABLET | ORAL | 3 refills | Status: DC

## 2019-10-09 NOTE — Addendum Note (Signed)
Addended by: Durwin Glaze on: 10/09/2019 01:59 PM   Modules accepted: Orders

## 2019-10-13 ENCOUNTER — Other Ambulatory Visit: Payer: Medicare HMO

## 2019-10-20 ENCOUNTER — Other Ambulatory Visit: Payer: Medicare HMO

## 2019-10-20 ENCOUNTER — Ambulatory Visit (INDEPENDENT_AMBULATORY_CARE_PROVIDER_SITE_OTHER): Payer: Medicare HMO

## 2019-10-20 ENCOUNTER — Other Ambulatory Visit: Payer: Self-pay

## 2019-10-20 DIAGNOSIS — Z90711 Acquired absence of uterus with remaining cervical stump: Secondary | ICD-10-CM | POA: Diagnosis not present

## 2019-10-20 DIAGNOSIS — R140 Abdominal distension (gaseous): Secondary | ICD-10-CM

## 2019-10-28 ENCOUNTER — Telehealth: Payer: Self-pay | Admitting: Obstetrics and Gynecology

## 2019-10-28 NOTE — Telephone Encounter (Signed)
The pt called and left a voice mail. The pt is wanting to know what came back from her ultrasound results. The pt is requesting a call back. Please advise

## 2019-11-03 NOTE — Telephone Encounter (Signed)
JW took care of the message.

## 2019-11-06 ENCOUNTER — Telehealth: Payer: Self-pay

## 2019-11-06 NOTE — Telephone Encounter (Signed)
Patient called in wanting an update on the U/S she had at her last appointment. Please advise.

## 2019-11-11 NOTE — Telephone Encounter (Signed)
Patient is aware of [place] results.

## 2020-01-21 ENCOUNTER — Other Ambulatory Visit: Payer: Self-pay | Admitting: Nurse Practitioner

## 2020-01-21 DIAGNOSIS — Z1231 Encounter for screening mammogram for malignant neoplasm of breast: Secondary | ICD-10-CM

## 2020-02-09 ENCOUNTER — Ambulatory Visit
Admission: RE | Admit: 2020-02-09 | Discharge: 2020-02-09 | Disposition: A | Discharge status: Home / Self Care | Payer: Medicare HMO | Source: Ambulatory Visit | Attending: Nurse Practitioner | Admitting: Nurse Practitioner

## 2020-02-09 DIAGNOSIS — Z1231 Encounter for screening mammogram for malignant neoplasm of breast: Secondary | ICD-10-CM | POA: Insufficient documentation

## 2020-07-04 ENCOUNTER — Encounter: Payer: Self-pay | Admitting: Ophthalmology

## 2020-07-04 ENCOUNTER — Other Ambulatory Visit: Payer: Self-pay

## 2020-07-05 ENCOUNTER — Other Ambulatory Visit
Admission: RE | Admit: 2020-07-05 | Discharge: 2020-07-05 | Disposition: A | Discharge status: Home / Self Care | Payer: Medicare HMO | Source: Ambulatory Visit | Attending: Ophthalmology | Admitting: Ophthalmology

## 2020-07-05 DIAGNOSIS — Z20822 Contact with and (suspected) exposure to covid-19: Secondary | ICD-10-CM | POA: Insufficient documentation

## 2020-07-05 DIAGNOSIS — Z01812 Encounter for preprocedural laboratory examination: Secondary | ICD-10-CM | POA: Diagnosis present

## 2020-07-05 LAB — SARS CORONAVIRUS 2 (TAT 6-24 HRS): SARS Coronavirus 2: NEGATIVE

## 2020-07-05 NOTE — Discharge Instructions (Signed)

## 2020-07-07 ENCOUNTER — Other Ambulatory Visit: Payer: Self-pay

## 2020-07-07 ENCOUNTER — Ambulatory Visit: Payer: Medicare HMO | Admitting: Anesthesiology

## 2020-07-07 ENCOUNTER — Encounter: Payer: Self-pay | Admitting: Ophthalmology

## 2020-07-07 ENCOUNTER — Ambulatory Visit
Admission: RE | Admit: 2020-07-07 | Discharge: 2020-07-07 | Disposition: A | Discharge status: Home / Self Care | Payer: Medicare HMO | Attending: Ophthalmology | Admitting: Ophthalmology

## 2020-07-07 ENCOUNTER — Encounter
Admission: RE | Disposition: A | Discharge status: Home / Self Care | Payer: Self-pay | Source: Home / Self Care | Attending: Ophthalmology

## 2020-07-07 DIAGNOSIS — Z7984 Long term (current) use of oral hypoglycemic drugs: Secondary | ICD-10-CM | POA: Diagnosis not present

## 2020-07-07 DIAGNOSIS — Z791 Long term (current) use of non-steroidal anti-inflammatories (NSAID): Secondary | ICD-10-CM | POA: Diagnosis not present

## 2020-07-07 DIAGNOSIS — H2512 Age-related nuclear cataract, left eye: Secondary | ICD-10-CM | POA: Insufficient documentation

## 2020-07-07 DIAGNOSIS — Z79899 Other long term (current) drug therapy: Secondary | ICD-10-CM | POA: Insufficient documentation

## 2020-07-07 DIAGNOSIS — Z87891 Personal history of nicotine dependence: Secondary | ICD-10-CM | POA: Insufficient documentation

## 2020-07-07 DIAGNOSIS — Z7982 Long term (current) use of aspirin: Secondary | ICD-10-CM | POA: Insufficient documentation

## 2020-07-07 DIAGNOSIS — Z79890 Hormone replacement therapy (postmenopausal): Secondary | ICD-10-CM | POA: Diagnosis not present

## 2020-07-07 HISTORY — PX: CATARACT EXTRACTION W/PHACO: SHX586

## 2020-07-07 HISTORY — DX: Presence of dental prosthetic device (complete) (partial): Z97.2

## 2020-07-07 LAB — GLUCOSE, CAPILLARY
Glucose-Capillary: 107 mg/dL — ABNORMAL HIGH (ref 70–99)
Glucose-Capillary: 109 mg/dL — ABNORMAL HIGH (ref 70–99)

## 2020-07-07 SURGERY — PHACOEMULSIFICATION, CATARACT, WITH IOL INSERTION
Anesthesia: Monitor Anesthesia Care | Site: Eye | Laterality: Left

## 2020-07-07 MED ORDER — LACTATED RINGERS IV SOLN: INTRAVENOUS | Status: DC

## 2020-07-07 MED ORDER — NA CHONDROIT SULF-NA HYALURON 40-17 MG/ML IO SOLN
INTRAOCULAR | Status: DC | PRN
  Administered 2020-07-07: 1 mL via INTRAOCULAR

## 2020-07-07 MED ORDER — TETRACAINE HCL 0.5 % OP SOLN
1.0000 [drp] | OPHTHALMIC | Status: DC | PRN
  Administered 2020-07-07 (×2): 1 [drp] via OPHTHALMIC

## 2020-07-07 MED ORDER — MOXIFLOXACIN HCL 0.5 % OP SOLN
OPHTHALMIC | Status: DC | PRN
  Administered 2020-07-07: 0.2 mL via OPHTHALMIC

## 2020-07-07 MED ORDER — EPINEPHRINE PF 1 MG/ML IJ SOLN
INTRAOCULAR | Status: DC | PRN
  Administered 2020-07-07: 61 mL via OPHTHALMIC

## 2020-07-07 MED ORDER — LIDOCAINE HCL (PF) 2 % IJ SOLN
INTRAOCULAR | Status: DC | PRN
  Administered 2020-07-07: 2 mL

## 2020-07-07 MED ORDER — MIDAZOLAM HCL 2 MG/2ML IJ SOLN
INTRAMUSCULAR | Status: DC | PRN
  Administered 2020-07-07: 1 mg via INTRAVENOUS

## 2020-07-07 MED ORDER — ACETAMINOPHEN 160 MG/5ML PO SOLN: 325.0000 mg | Freq: Once | ORAL | Status: DC

## 2020-07-07 MED ORDER — ACETAMINOPHEN 325 MG PO TABS: 325.0000 mg | ORAL_TABLET | Freq: Once | ORAL | Status: DC

## 2020-07-07 MED ORDER — FENTANYL CITRATE (PF) 100 MCG/2ML IJ SOLN
INTRAMUSCULAR | Status: DC | PRN
  Administered 2020-07-07: 50 ug via INTRAVENOUS

## 2020-07-07 MED ORDER — ARMC OPHTHALMIC DILATING DROPS
1.0000 "application " | OPHTHALMIC | Status: DC | PRN
  Administered 2020-07-07 (×3): 1 via OPHTHALMIC

## 2020-07-07 MED ORDER — BRIMONIDINE TARTRATE-TIMOLOL 0.2-0.5 % OP SOLN
OPHTHALMIC | Status: DC | PRN
  Administered 2020-07-07: 1 [drp] via OPHTHALMIC

## 2020-07-07 SURGICAL SUPPLY — 17 items
CANNULA ANT/CHMB 27GA (MISCELLANEOUS) ×6 IMPLANT
GLOVE SURG LX 8.0 MICRO (GLOVE) ×2
GLOVE SURG LX STRL 8.0 MICRO (GLOVE) ×1 IMPLANT
GLOVE SURG TRIUMPH 8.0 PF LTX (GLOVE) ×3 IMPLANT
GOWN STRL REUS W/ TWL LRG LVL3 (GOWN DISPOSABLE) ×2 IMPLANT
GOWN STRL REUS W/TWL LRG LVL3 (GOWN DISPOSABLE) ×6
LENS IOL TECNIS EYHANCE 17.5 (Intraocular Lens) ×3 IMPLANT
MARKER SKIN DUAL TIP RULER LAB (MISCELLANEOUS) ×3 IMPLANT
NEEDLE FILTER BLUNT 18X 1/2SAF (NEEDLE) ×2
NEEDLE FILTER BLUNT 18X1 1/2 (NEEDLE) ×1 IMPLANT
PACK EYE AFTER SURG (MISCELLANEOUS) ×3 IMPLANT
PACK OPTHALMIC (MISCELLANEOUS) ×3 IMPLANT
PACK PORFILIO (MISCELLANEOUS) ×3 IMPLANT
SYR 3ML LL SCALE MARK (SYRINGE) ×3 IMPLANT
SYR TB 1ML LUER SLIP (SYRINGE) ×3 IMPLANT
WATER STERILE IRR 250ML POUR (IV SOLUTION) ×3 IMPLANT
WIPE NON LINTING 3.25X3.25 (MISCELLANEOUS) ×3 IMPLANT

## 2020-07-07 NOTE — Transfer of Care (Signed)
Immediate Anesthesia Transfer of Care Note  Patient: Kristin Greer  Procedure(s) Performed: CATARACT EXTRACTION PHACO AND INTRAOCULAR LENS PLACEMENT (IOC) LEFT DIABETIC 4.12 00:34.6 (Left Eye)  Patient Location: PACU  Anesthesia Type: MAC  Level of Consciousness: awake, alert  and patient cooperative  Airway and Oxygen Therapy: Patient Spontanous Breathing and Patient connected to supplemental oxygen  Post-op Assessment: Post-op Vital signs reviewed, Patient's Cardiovascular Status Stable, Respiratory Function Stable, Patent Airway and No signs of Nausea or vomiting  Post-op Vital Signs: Reviewed and stable  Complications: No complications documented.

## 2020-07-07 NOTE — Anesthesia Postprocedure Evaluation (Signed)
Anesthesia Post Note  Patient: Kristin Greer  Procedure(s) Performed: CATARACT EXTRACTION PHACO AND INTRAOCULAR LENS PLACEMENT (IOC) LEFT DIABETIC 4.12 00:34.6 (Left Eye)     Patient location during evaluation: PACU Anesthesia Type: MAC Level of consciousness: awake and alert and oriented Pain management: satisfactory to patient Vital Signs Assessment: post-procedure vital signs reviewed and stable Respiratory status: spontaneous breathing, nonlabored ventilation and respiratory function stable Cardiovascular status: blood pressure returned to baseline and stable Postop Assessment: Adequate PO intake and No signs of nausea or vomiting Anesthetic complications: no   No complications documented.  Raliegh Ip

## 2020-07-07 NOTE — H&P (Signed)
[facility]   Primary Care Physician:  Danelle Berry, NP Ophthalmologist: Dr. George Ina  Pre-Procedure History & Physical: HPI:  Kristin Greer is a 64 y.o. female here for cataract surgery.   Past Medical History:  Diagnosis Date  . Anxiety   . Arthritis   . Bursitis   . Constipation   . Fibroids   . Gout   . Hemorrhoids   . Hyperlipidemia   . Hypertension   . Hypothyroid   . Migraine   . Wears dentures    full upper and lower    Past Surgical History:  Procedure Laterality Date  . DG TEETH FULL     all teeth pulled has partials  . LAPAROSCOPIC SUPRACERVICAL HYSTERECTOMY  2012  . turmors removed     one in [employer] yrs ago; 5-6 removed 2015 [place]    Prior to Admission medications   Medication Sig Start Date End Date Taking? Authorizing Provider  aspirin EC 81 MG tablet Take 1 tablet by mouth daily.   Yes [provider]  atorvastatin (LIPITOR) 10 MG tablet Take 1 tablet by mouth daily.   Yes [provider]  benztropine (COGENTIN) 1 MG tablet Take 0.5 tablets by mouth daily.  12/04/17  Yes [provider]  cetirizine (ZYRTEC) 10 MG tablet Take 1 tablet by mouth daily.   Yes [provider]  Cholecalciferol 25 MCG (1000 UT) tablet Take 1 tablet by mouth once a week.   Yes [provider]  fluPHENAZine (PROLIXIN) 5 MG tablet Take 10 mg by mouth daily.  09/26/17  Yes [provider]  fluticasone (FLONASE) 50 MCG/ACT nasal spray Place 1 spray into both nostrils daily. 11/06/17  Yes [provider]  fluticasone (FLOVENT DISKUS) 50 MCG/BLIST diskus inhaler Inhale 1 Device into the lungs as needed.   Yes [provider]  gabapentin (NEURONTIN) 100 MG capsule Take 1 capsule by mouth daily. 12/04/17  Yes [provider]  hydrochlorothiazide (HYDRODIURIL) 12.5 MG tablet hydroCHLOROthiazide 12.5 MG Oral Tablet QTY: 0 tablet Days: 0 Refills: 0  Written: 06/04/18 Patient Instructions: qd 06/04/18   Yes [provider]  hydrOXYzine (VISTARIL) 25 MG capsule Take 1 capsule by mouth 2 (two) times daily. 11/21/17  Yes [provider]  levothyroxine (SYNTHROID, LEVOTHROID) 50 MCG tablet Take 1 tablet by mouth daily. am 11/22/17  Yes [provider]  meloxicam (MOBIC) 7.5 MG tablet Take 1 tablet by mouth daily. 12/04/17  Yes [provider]  metFORMIN (GLUCOPHAGE) 500 MG tablet Take 1 tablet by mouth 2 (two) times daily. 11/21/17  Yes [provider]  montelukast (SINGULAIR) 10 MG tablet Take 1 tablet by mouth daily. 12/04/17  Yes [provider]  naproxen sodium (ALEVE) 220 MG tablet Take 220 mg by mouth daily as needed.   Yes [provider]  NICOTINE POLACRILEX MT Use as directed 4 mg in the mouth or throat daily.   Yes [provider]  OLANZapine (ZYPREXA) 20 MG tablet Take 1 tablet by mouth at bedtime. 11/21/17  Yes [provider]  Omega-3 Fatty Acids (SM FISH OIL) 1000 MG CAPS Take 1 capsule by mouth 2 (two) times daily.   Yes [provider]  simethicone (MYLICON) 563 MG chewable tablet Chew 125 mg by mouth every 6 (six) hours as needed for flatulence.   Yes [provider]  traZODone (DESYREL) 50 MG tablet Take 1 tablet by mouth 2 (two) times daily. 12/04/17  Yes [provider]  albuterol (PROVENTIL HFA) 108 (90 Base) MCG/ACT inhaler Inhale 2 puffs into the lungs every 6 (six) hours as needed.    [provider]  diclofenac sodium (VOLTAREN) 1 % GEL Apply 1 application topically 2 (two) times daily. Patient not taking: Reported on 07/04/2020    [provider]  fluPHENAZine decanoate (PROLIXIN) 25 MG/ML injection Inject 25 mg into the muscle every 14 (fourteen) days. 11/07/17   [provider]  paroxetine mesylate (PEXEVA) 10 MG tablet Take 1 tablet (10 mg total) by mouth every morning. Patient not taking: Reported on 07/04/2020 10/09/19   Harlin Heys, MD   tiZANidine (ZANAFLEX) 4 MG tablet tiZANidine HCl 4 MG Oral Tablet QTY: 0 tablet Days: 0 Refills: 0  Written: 06/04/18 Patient Instructions: tid Patient not taking: tiZANidine HCl 4 MG Oral Tablet QTY: 0 tablet Days: 0 Refills: 0&nbsp;&nbsp;Written: 06/04/18 Patient Instructions: tid 06/04/18   [provider]    Allergies as of 05/31/2020  . (No Known Allergies)    Family History  Problem Relation Age of Onset  . Breast cancer Sister 61  . Lupus Sister     Social History   Socioeconomic History  . Marital status: Divorced    Spouse name: Not on file  . Number of children: Not on file  . Years of education: Not on file  . Highest education level: Not on file  Occupational History  . Not on file  Tobacco Use  . Smoking status: Former Smoker    Packs/day: 0.50    Types: Cigarettes    Quit date: 09/28/2019    Years since quitting: 0.7  . Smokeless tobacco: Never Used  Vaping Use  . Vaping Use: Former  Substance and Sexual Activity  . Alcohol use: Yes    Comment: rarely  . Drug use: Never  . Sexual activity: Not Currently    Birth control/protection: Post-menopausal  Other Topics Concern  . Not on file  Social History Narrative  . Not on file   Social Determinants of Health   Financial Resource Strain: Not on file  Food Insecurity: Not on file  Transportation Needs: Not on file  Physical Activity: Not on file  Stress: Not on file  Social Connections: Not on file  Intimate Partner Violence: Not on file    Review of Systems: See HPI, otherwise negative ROS  Physical Exam: BP 136/78   Pulse 68   Temp (!) 96.4 F (35.8 C) (Temporal)   Ht 5\' 5"  (1.651 m)   Wt 72.6 kg   SpO2 99%   BMI 26.63 kg/m  General:   Alert,  pleasant and cooperative in NAD Head:  Normocephalic and atraumatic. Respiratory:  Normal work of breathing. Heart:  Regular rate and rhythm.   Impression/Plan: Kristin Greer is here for cataract surgery.  Risks, benefits, limitations,  and alternatives regarding cataract surgery have been reviewed with the patient.  Questions have been answered.  All parties agreeable.   Birder Robson, MD  07/07/2020, 9:59 AM

## 2020-07-07 NOTE — Anesthesia Preprocedure Evaluation (Signed)
Anesthesia Evaluation  Patient identified by MRN, date of birth, ID band Patient awake    Reviewed: Allergy & Precautions, H&P , NPO status , Patient's Chart, lab work & pertinent test results  Airway Mallampati: II  TM Distance: >3 FB Neck ROM: full    Dental  (+) Edentulous Upper, Edentulous Lower   Pulmonary former smoker,    Pulmonary exam normal breath sounds clear to auscultation       Cardiovascular hypertension, Normal cardiovascular exam Rhythm:regular Rate:Normal     Neuro/Psych    GI/Hepatic   Endo/Other  diabetesHypothyroidism   Renal/GU      Musculoskeletal   Abdominal   Peds  Hematology   Anesthesia Other Findings   Reproductive/Obstetrics                             Anesthesia Physical Anesthesia Plan  ASA: II  Anesthesia Plan: MAC   Post-op Pain Management:    Induction:   PONV Risk Score and Plan: 2 and Treatment may vary due to age or medical condition, TIVA and Midazolam  Airway Management Planned:   Additional Equipment:   Intra-op Plan:   Post-operative Plan:   Informed Consent: I have reviewed the patients History and Physical, chart, labs and discussed the procedure including the risks, benefits and alternatives for the proposed anesthesia with the patient or authorized representative who has indicated his/her understanding and acceptance.     Dental Advisory Given  Plan Discussed with: CRNA  Anesthesia Plan Comments:         Anesthesia Quick Evaluation

## 2020-07-07 NOTE — Op Note (Signed)
PREOPERATIVE DIAGNOSIS:  Nuclear sclerotic cataract of the left eye.   POSTOPERATIVE DIAGNOSIS:  Nuclear sclerotic cataract of the left eye.   OPERATIVE PROCEDURE:@   SURGEON:  Birder Robson, MD.   ANESTHESIA:  Anesthesiologist: Ronelle Nigh, MD CRNA: Cameron Ali, CRNA  1.      Managed anesthesia care. 2.     0.19ml of Shugarcaine was instilled following the paracentesis   COMPLICATIONS:  None.   TECHNIQUE:   Stop and chop   DESCRIPTION OF PROCEDURE:  The patient was examined and consented in the preoperative holding area where the aforementioned topical anesthesia was applied to the left eye and then brought back to the Operating Room where the left eye was prepped and draped in the usual sterile ophthalmic fashion and a lid speculum was placed. A paracentesis was created with the side port blade and the anterior chamber was filled with viscoelastic. A near clear corneal incision was performed with the steel keratome. A continuous curvilinear capsulorrhexis was performed with a cystotome followed by the capsulorrhexis forceps. Hydrodissection and hydrodelineation were carried out with BSS on a blunt cannula. The lens was removed in a stop and chop  technique and the remaining cortical material was removed with the irrigation-aspiration handpiece. The capsular bag was inflated with viscoelastic and the Technis ZCB00 lens was placed in the capsular bag without complication. The remaining viscoelastic was removed from the eye with the irrigation-aspiration handpiece. The wounds were hydrated. The anterior chamber was flushed with BSS and the eye was inflated to physiologic pressure. 0.83ml Vigamox was placed in the anterior chamber. The wounds were found to be water tight. The eye was dressed with Combigan. The patient was given protective glasses to wear throughout the day and a shield with which to sleep tonight. The patient was also given drops with which to begin a drop regimen today and  will follow-up with me in one day. Implant Name Type Inv. Item Serial No. Manufacturer Lot No. LRB No. Used Action  LENS IOL TECNIS EYHANCE 17.5 - [id number] Intraocular Lens LENS IOL TECNIS EYHANCE 17.5 [id number] JOHNSON   Left 1 Implanted    Procedure(s) with comments: CATARACT EXTRACTION PHACO AND INTRAOCULAR LENS PLACEMENT (IOC) LEFT DIABETIC 4.12 00:34.6 (Left) - Diabetic - oral meds  Electronically signed: Birder Robson 07/07/2020 10:23 AM

## 2020-07-08 ENCOUNTER — Encounter: Payer: Self-pay | Admitting: Ophthalmology

## 2020-07-20 ENCOUNTER — Other Ambulatory Visit: Payer: Self-pay

## 2020-07-20 ENCOUNTER — Encounter: Payer: Self-pay | Admitting: Ophthalmology

## 2020-07-28 NOTE — Discharge Instructions (Signed)

## 2020-07-29 ENCOUNTER — Other Ambulatory Visit
Admission: RE | Admit: 2020-07-29 | Discharge: 2020-07-29 | Disposition: A | Discharge status: Home / Self Care | Payer: Medicare HMO | Source: Ambulatory Visit | Attending: Ophthalmology | Admitting: Ophthalmology

## 2020-07-29 ENCOUNTER — Other Ambulatory Visit: Payer: Self-pay

## 2020-07-29 DIAGNOSIS — Z20822 Contact with and (suspected) exposure to covid-19: Secondary | ICD-10-CM | POA: Diagnosis not present

## 2020-07-29 DIAGNOSIS — Z01812 Encounter for preprocedural laboratory examination: Secondary | ICD-10-CM | POA: Insufficient documentation

## 2020-07-30 LAB — SARS CORONAVIRUS 2 (TAT 6-24 HRS): SARS Coronavirus 2: NEGATIVE

## 2020-08-02 ENCOUNTER — Ambulatory Visit
Admission: RE | Admit: 2020-08-02 | Discharge: 2020-08-02 | Disposition: A | Discharge status: Home / Self Care | Payer: Medicare HMO | Attending: Ophthalmology | Admitting: Ophthalmology

## 2020-08-02 ENCOUNTER — Ambulatory Visit: Payer: Medicare HMO | Admitting: Anesthesiology

## 2020-08-02 ENCOUNTER — Encounter: Payer: Self-pay | Admitting: Ophthalmology

## 2020-08-02 ENCOUNTER — Encounter
Admission: RE | Disposition: A | Discharge status: Home / Self Care | Payer: Self-pay | Source: Home / Self Care | Attending: Ophthalmology

## 2020-08-02 ENCOUNTER — Other Ambulatory Visit: Payer: Self-pay

## 2020-08-02 DIAGNOSIS — Z7982 Long term (current) use of aspirin: Secondary | ICD-10-CM | POA: Insufficient documentation

## 2020-08-02 DIAGNOSIS — Z7951 Long term (current) use of inhaled steroids: Secondary | ICD-10-CM | POA: Diagnosis not present

## 2020-08-02 DIAGNOSIS — E1136 Type 2 diabetes mellitus with diabetic cataract: Secondary | ICD-10-CM | POA: Insufficient documentation

## 2020-08-02 DIAGNOSIS — Z79899 Other long term (current) drug therapy: Secondary | ICD-10-CM | POA: Insufficient documentation

## 2020-08-02 DIAGNOSIS — Z7984 Long term (current) use of oral hypoglycemic drugs: Secondary | ICD-10-CM | POA: Diagnosis not present

## 2020-08-02 DIAGNOSIS — H2511 Age-related nuclear cataract, right eye: Secondary | ICD-10-CM | POA: Diagnosis not present

## 2020-08-02 DIAGNOSIS — Z87891 Personal history of nicotine dependence: Secondary | ICD-10-CM | POA: Diagnosis not present

## 2020-08-02 DIAGNOSIS — Z791 Long term (current) use of non-steroidal anti-inflammatories (NSAID): Secondary | ICD-10-CM | POA: Diagnosis not present

## 2020-08-02 HISTORY — PX: CATARACT EXTRACTION W/PHACO: SHX586

## 2020-08-02 LAB — GLUCOSE, CAPILLARY
Glucose-Capillary: 106 mg/dL — ABNORMAL HIGH (ref 70–99)
Glucose-Capillary: 107 mg/dL — ABNORMAL HIGH (ref 70–99)

## 2020-08-02 SURGERY — PHACOEMULSIFICATION, CATARACT, WITH IOL INSERTION
Anesthesia: Monitor Anesthesia Care | Site: Eye | Laterality: Right

## 2020-08-02 MED ORDER — LIDOCAINE HCL (PF) 2 % IJ SOLN
INTRAOCULAR | Status: DC | PRN
  Administered 2020-08-02: 2 mL

## 2020-08-02 MED ORDER — ONDANSETRON HCL 4 MG/2ML IJ SOLN: 4.0000 mg | Freq: Once | INTRAMUSCULAR | Status: DC | PRN

## 2020-08-02 MED ORDER — ACETAMINOPHEN 160 MG/5ML PO SOLN: 325.0000 mg | ORAL | Status: DC | PRN

## 2020-08-02 MED ORDER — NA CHONDROIT SULF-NA HYALURON 40-17 MG/ML IO SOLN
INTRAOCULAR | Status: DC | PRN
  Administered 2020-08-02: 1 mL via INTRAOCULAR

## 2020-08-02 MED ORDER — EPINEPHRINE PF 1 MG/ML IJ SOLN
INTRAOCULAR | Status: DC | PRN
  Administered 2020-08-02: 60 mL via OPHTHALMIC

## 2020-08-02 MED ORDER — LACTATED RINGERS IV SOLN: INTRAVENOUS | Status: DC

## 2020-08-02 MED ORDER — FENTANYL CITRATE (PF) 100 MCG/2ML IJ SOLN
INTRAMUSCULAR | Status: DC | PRN
  Administered 2020-08-02: 50 ug via INTRAVENOUS

## 2020-08-02 MED ORDER — BRIMONIDINE TARTRATE-TIMOLOL 0.2-0.5 % OP SOLN
OPHTHALMIC | Status: DC | PRN
  Administered 2020-08-02: 1 [drp] via OPHTHALMIC

## 2020-08-02 MED ORDER — ARMC OPHTHALMIC DILATING DROPS
1.0000 "application " | OPHTHALMIC | Status: DC | PRN
  Administered 2020-08-02 (×3): 1 via OPHTHALMIC

## 2020-08-02 MED ORDER — MIDAZOLAM HCL 2 MG/2ML IJ SOLN
INTRAMUSCULAR | Status: DC | PRN
  Administered 2020-08-02: 2 mg via INTRAVENOUS

## 2020-08-02 MED ORDER — TETRACAINE HCL 0.5 % OP SOLN
1.0000 [drp] | OPHTHALMIC | Status: DC | PRN
  Administered 2020-08-02 (×3): 1 [drp] via OPHTHALMIC

## 2020-08-02 MED ORDER — ACETAMINOPHEN 325 MG PO TABS: 325.0000 mg | ORAL_TABLET | ORAL | Status: DC | PRN

## 2020-08-02 MED ORDER — MOXIFLOXACIN HCL 0.5 % OP SOLN
OPHTHALMIC | Status: DC | PRN
  Administered 2020-08-02: 0.2 mL via OPHTHALMIC

## 2020-08-02 SURGICAL SUPPLY — 17 items
CANNULA ANT/CHMB 27GA (MISCELLANEOUS) ×4 IMPLANT
GLOVE SURG LX 8.0 MICRO (GLOVE) ×1
GLOVE SURG LX STRL 8.0 MICRO (GLOVE) ×1 IMPLANT
GLOVE SURG TRIUMPH 8.0 PF LTX (GLOVE) ×2 IMPLANT
GOWN STRL REUS W/ TWL LRG LVL3 (GOWN DISPOSABLE) ×2 IMPLANT
GOWN STRL REUS W/TWL LRG LVL3 (GOWN DISPOSABLE) ×2
LENS IOL TECNIS EYHANCE 17.5 (Intraocular Lens) ×2 IMPLANT
MARKER SKIN DUAL TIP RULER LAB (MISCELLANEOUS) ×2 IMPLANT
NEEDLE FILTER BLUNT 18X 1/2SAF (NEEDLE) ×1
NEEDLE FILTER BLUNT 18X1 1/2 (NEEDLE) ×1 IMPLANT
PACK EYE AFTER SURG (MISCELLANEOUS) ×2 IMPLANT
PACK OPTHALMIC (MISCELLANEOUS) ×2 IMPLANT
PACK PORFILIO (MISCELLANEOUS) ×2 IMPLANT
SYR 3ML LL SCALE MARK (SYRINGE) ×2 IMPLANT
SYR TB 1ML LUER SLIP (SYRINGE) ×2 IMPLANT
WATER STERILE IRR 250ML POUR (IV SOLUTION) ×2 IMPLANT
WIPE NON LINTING 3.25X3.25 (MISCELLANEOUS) ×2 IMPLANT

## 2020-08-02 NOTE — Anesthesia Postprocedure Evaluation (Signed)
Anesthesia Post Note  Patient: Kristin Greer  Procedure(s) Performed: CATARACT EXTRACTION PHACO AND INTRAOCULAR LENS PLACEMENT (IOC) RIGHT DIABETIC 4.25 00:34.5 (Right Eye)     Patient location during evaluation: PACU Anesthesia Type: MAC Level of consciousness: awake and alert Pain management: pain level controlled Vital Signs Assessment: post-procedure vital signs reviewed and stable Respiratory status: spontaneous breathing, nonlabored ventilation, respiratory function stable and patient connected to nasal cannula oxygen Cardiovascular status: stable and blood pressure returned to baseline Postop Assessment: no apparent nausea or vomiting Anesthetic complications: no   No complications documented.  Alisa Graff

## 2020-08-02 NOTE — H&P (Signed)
[facility]   Primary Care Physician:  Fayrene Helper, NP Ophthalmologist: Dr. Druscilla Brownie  Pre-Procedure History & Physical: HPI:  Kristin Greer is a 65 y.o. female here for cataract surgery.   Past Medical History:  Diagnosis Date  . Anxiety   . Arthritis   . Bursitis   . Constipation   . Fibroids   . Gout   . Hemorrhoids   . Hyperlipidemia   . Hypertension   . Hypothyroid   . Migraine   . Wears dentures    full upper and lower    Past Surgical History:  Procedure Laterality Date  . CATARACT EXTRACTION W/PHACO Left 07/07/2020   Procedure: CATARACT EXTRACTION PHACO AND INTRAOCULAR LENS PLACEMENT (IOC) LEFT DIABETIC 4.12 00:34.6;  Surgeon: Galen Manila, MD;  Location: [facility];  Service: Ophthalmology;  Laterality: Left;  Diabetic - oral meds  . DG TEETH FULL     all teeth pulled has partials  . LAPAROSCOPIC SUPRACERVICAL HYSTERECTOMY  2012  . turmors removed     one in [employer] yrs ago; 5-6 removed 2015 [facility]    Prior to Admission medications   Medication Sig Start Date End Date Taking? Authorizing Provider  albuterol (VENTOLIN HFA) 108 (90 Base) MCG/ACT inhaler Inhale 2 puffs into the lungs every 6 (six) hours as needed.   Yes [provider]  aspirin EC 81 MG tablet Take 1 tablet by mouth daily.   Yes [provider]  atorvastatin (LIPITOR) 10 MG tablet Take 1 tablet by mouth daily.   Yes [provider]  benztropine (COGENTIN) 1 MG tablet Take 0.5 tablets by mouth daily.  12/04/17  Yes [provider]  cetirizine (ZYRTEC) 10 MG tablet Take 1 tablet by mouth daily.   Yes [provider]  Cholecalciferol 25 MCG (1000 UT) tablet Take 1 tablet by mouth once a week.   Yes [provider]  fluPHENAZine (PROLIXIN) 5 MG tablet Take 10 mg by mouth daily.  09/26/17  Yes [provider]  fluPHENAZine decanoate (PROLIXIN) 25 MG/ML injection Inject 25 mg into the muscle every 14 (fourteen) days.  11/07/17  Yes [provider]  fluticasone (FLONASE) 50 MCG/ACT nasal spray Place 1 spray into both nostrils daily. 11/06/17  Yes [provider]  fluticasone (FLOVENT DISKUS) 50 MCG/BLIST diskus inhaler Inhale 1 Device into the lungs as needed.   Yes [provider]  gabapentin (NEURONTIN) 100 MG capsule Take 1 capsule by mouth daily. 12/04/17  Yes [provider]  hydrochlorothiazide (HYDRODIURIL) 12.5 MG tablet hydroCHLOROthiazide 12.5 MG Oral Tablet QTY: 0 tablet Days: 0 Refills: 0  Written: 06/04/18 Patient Instructions: qd 06/04/18  Yes [provider]  hydrOXYzine (VISTARIL) 25 MG capsule Take 1 capsule by mouth 2 (two) times daily. 11/21/17  Yes [provider]  levothyroxine (SYNTHROID, LEVOTHROID) 50 MCG tablet Take 1 tablet by mouth daily. am 11/22/17  Yes [provider]  meloxicam (MOBIC) 7.5 MG tablet Take 1 tablet by mouth daily. 12/04/17  Yes [provider]  metFORMIN (GLUCOPHAGE) 500 MG tablet Take 1 tablet by mouth 2 (two) times daily. 11/21/17  Yes [provider]  montelukast (SINGULAIR) 10 MG tablet Take 1 tablet by mouth daily. 12/04/17  Yes [provider]  naproxen sodium (ALEVE) 220 MG tablet Take 220 mg by mouth daily as needed.   Yes [provider]  NICOTINE POLACRILEX MT Use as directed 4 mg in the mouth or throat daily.   Yes [provider]  OLANZapine (ZYPREXA) 20 MG tablet Take 1 tablet by mouth at bedtime. 11/21/17  Yes [provider]  Omega-3 Fatty Acids (SM FISH OIL) 1000 MG CAPS Take 1 capsule by mouth 2 (two) times daily.   Yes [provider]  simethicone (MYLICON) 0000000 MG chewable tablet Chew 125 mg by mouth every 6 (six) hours as needed for flatulence.   Yes [provider]  traZODone (DESYREL) 50 MG tablet Take 1 tablet by mouth 2 (two) times daily. 12/04/17  Yes [provider]  diclofenac sodium (VOLTAREN) 1 % GEL Apply 1  application topically 2 (two) times daily. Patient not taking: Reported on 07/04/2020    [provider]  paroxetine mesylate (PEXEVA) 10 MG tablet Take 1 tablet (10 mg total) by mouth every morning. Patient not taking: Reported on 07/04/2020 10/09/19   Harlin Heys, MD  tiZANidine (ZANAFLEX) 4 MG tablet tiZANidine HCl 4 MG Oral Tablet QTY: 0 tablet Days: 0 Refills: 0  Written: 06/04/18 Patient Instructions: tid Patient not taking: tiZANidine HCl 4 MG Oral Tablet QTY: 0 tablet Days: 0 Refills: 0&nbsp;&nbsp;Written: 06/04/18 Patient Instructions: tid 06/04/18   [provider]    Allergies as of 07/11/2020  . (No Known Allergies)    Family History  Problem Relation Age of Onset  . Breast cancer Sister 38  . Lupus Sister     Social History   Socioeconomic History  . Marital status: Divorced    Spouse name: Not on file  . Number of children: Not on file  . Years of education: Not on file  . Highest education level: Not on file  Occupational History  . Not on file  Tobacco Use  . Smoking status: Former Smoker    Packs/day: 0.50    Types: Cigarettes    Quit date: 09/28/2019    Years since quitting: 0.8  . Smokeless tobacco: Never Used  Vaping Use  . Vaping Use: Former  Substance and Sexual Activity  . Alcohol use: Yes    Comment: rarely  . Drug use: Never  . Sexual activity: Not Currently    Birth control/protection: Post-menopausal  Other Topics Concern  . Not on file  Social History Narrative  . Not on file   Social Determinants of Health   Financial Resource Strain: Not on file  Food Insecurity: Not on file  Transportation Needs: Not on file  Physical Activity: Not on file  Stress: Not on file  Social Connections: Not on file  Intimate Partner Violence: Not on file    Review of Systems: See HPI, otherwise negative ROS  Physical Exam: BP 137/86   Pulse 89   Temp 97.8 F (36.6 C) (Temporal)   Ht 5\' 5"  (1.651 m)   Wt 73 kg   SpO2 94%    BMI 26.79 kg/m  General:   Alert,  pleasant and cooperative in NAD Head:  Normocephalic and atraumatic. Respiratory:  Normal work of breathing.  Impression/Plan: Kristin Greer is here for cataract surgery.  Risks, benefits, limitations, and alternatives regarding cataract surgery have been reviewed with the patient.  Questions have been answered.  All parties agreeable.   Birder Robson, MD  08/02/2020, 9:12 AM

## 2020-08-02 NOTE — Anesthesia Preprocedure Evaluation (Signed)
Anesthesia Evaluation  Patient identified by MRN, date of birth, ID band Patient awake    Reviewed: Allergy & Precautions, H&P , NPO status , Patient's Chart, lab work & pertinent test results, reviewed documented beta blocker date and time   Airway Mallampati: II  TM Distance: >3 FB Neck ROM: full    Dental  (+) Upper Dentures, Lower Dentures   Pulmonary neg pulmonary ROS, former smoker,    Pulmonary exam normal breath sounds clear to auscultation       Cardiovascular Exercise Tolerance: Good hypertension, negative cardio ROS   Rhythm:regular Rate:Normal     Neuro/Psych  Headaches, Anxiety    GI/Hepatic negative GI ROS, Neg liver ROS,   Endo/Other  negative endocrine ROSHypothyroidism   Renal/GU negative Renal ROS  negative genitourinary   Musculoskeletal   Abdominal   Peds  Hematology negative hematology ROS (+)   Anesthesia Other Findings   Reproductive/Obstetrics negative OB ROS                             Anesthesia Physical Anesthesia Plan  ASA: II  Anesthesia Plan: MAC   Post-op Pain Management:    Induction:   PONV Risk Score and Plan: 2 and Treatment may vary due to age or medical condition  Airway Management Planned:   Additional Equipment:   Intra-op Plan:   Post-operative Plan:   Informed Consent: I have reviewed the patients History and Physical, chart, labs and discussed the procedure including the risks, benefits and alternatives for the proposed anesthesia with the patient or authorized representative who has indicated his/her understanding and acceptance.     Dental Advisory Given  Plan Discussed with: CRNA  Anesthesia Plan Comments:         Anesthesia Quick Evaluation

## 2020-08-02 NOTE — Op Note (Signed)
PREOPERATIVE DIAGNOSIS:  Nuclear sclerotic cataract of the right eye.   POSTOPERATIVE DIAGNOSIS:  Cataract   OPERATIVE PROCEDURE:@   SURGEON:  Kristin Manila, MD.   ANESTHESIA:  Anesthesiologist: Scarlette Slice, MD CRNA: Jinny Blossom, CRNA  1.      Managed anesthesia care. 2.      0.46ml of Shugarcaine was instilled in the eye following the paracentesis.   COMPLICATIONS:  None.   TECHNIQUE:   Stop and chop   DESCRIPTION OF PROCEDURE:  The patient was examined and consented in the preoperative holding area where the aforementioned topical anesthesia was applied to the right eye and then brought back to the Operating Room where the right eye was prepped and draped in the usual sterile ophthalmic fashion and a lid speculum was placed. A paracentesis was created with the side port blade and the anterior chamber was filled with viscoelastic. A near clear corneal incision was performed with the steel keratome. A continuous curvilinear capsulorrhexis was performed with a cystotome followed by the capsulorrhexis forceps. Hydrodissection and hydrodelineation were carried out with BSS on a blunt cannula. The lens was removed in a stop and chop  technique and the remaining cortical material was removed with the irrigation-aspiration handpiece. The capsular bag was inflated with viscoelastic and the Technis ZCB00  lens was placed in the capsular bag without complication. The remaining viscoelastic was removed from the eye with the irrigation-aspiration handpiece. The wounds were hydrated. The anterior chamber was flushed with BSS and the eye was inflated to physiologic pressure. 0.69ml of Vigamox was placed in the anterior chamber. The wounds were found to be water tight. The eye was dressed with Combigan. The patient was given protective glasses to wear throughout the day and a shield with which to sleep tonight. The patient was also given drops with which to begin a drop regimen today and will  follow-up with me in one day. Implant Name Type Inv. Item Serial No. Manufacturer Lot No. LRB No. Used Action  LENS IOL TECNIS EYHANCE 17.5 - [id number] Intraocular Lens LENS IOL TECNIS EYHANCE 17.5 [id number] JOHNSON   Right 1 Implanted   Procedure(s): CATARACT EXTRACTION PHACO AND INTRAOCULAR LENS PLACEMENT (IOC) RIGHT DIABETIC 4.25 00:34.5 (Right)  Electronically signed: Galen Greer 08/02/2020 9:32 AM

## 2020-08-02 NOTE — Transfer of Care (Signed)
Immediate Anesthesia Transfer of Care Note  Patient: Kristin Greer  Procedure(s) Performed: CATARACT EXTRACTION PHACO AND INTRAOCULAR LENS PLACEMENT (IOC) RIGHT DIABETIC 4.25 00:34.5 (Right Eye)  Patient Location: PACU  Anesthesia Type: MAC  Level of Consciousness: awake, alert  and patient cooperative  Airway and Oxygen Therapy: Patient Spontanous Breathing and Patient connected to supplemental oxygen  Post-op Assessment: Post-op Vital signs reviewed, Patient's Cardiovascular Status Stable, Respiratory Function Stable, Patent Airway and No signs of Nausea or vomiting  Post-op Vital Signs: Reviewed and stable  Complications: No complications documented.

## 2020-08-02 NOTE — Anesthesia Procedure Notes (Signed)
Procedure Name: MAC Date/Time: 08/02/2020 9:18 AM Performed by: Jeannene Patella, CRNA Pre-anesthesia Checklist: Patient identified, Emergency Drugs available, Suction available, Timeout performed and Patient being monitored Patient Re-evaluated:Patient Re-evaluated prior to induction Oxygen Delivery Method: Nasal cannula Placement Confirmation: positive ETCO2

## 2020-08-03 ENCOUNTER — Encounter: Payer: Self-pay | Admitting: Ophthalmology

## 2021-01-05 ENCOUNTER — Other Ambulatory Visit: Payer: Self-pay | Admitting: Nurse Practitioner

## 2021-01-05 DIAGNOSIS — Z1231 Encounter for screening mammogram for malignant neoplasm of breast: Secondary | ICD-10-CM

## 2021-02-01 DIAGNOSIS — E785 Hyperlipidemia, unspecified: Secondary | ICD-10-CM | POA: Diagnosis not present

## 2021-02-01 DIAGNOSIS — J309 Allergic rhinitis, unspecified: Secondary | ICD-10-CM | POA: Diagnosis not present

## 2021-02-01 DIAGNOSIS — I10 Essential (primary) hypertension: Secondary | ICD-10-CM | POA: Diagnosis not present

## 2021-02-01 DIAGNOSIS — E039 Hypothyroidism, unspecified: Secondary | ICD-10-CM | POA: Diagnosis not present

## 2021-02-01 DIAGNOSIS — E1165 Type 2 diabetes mellitus with hyperglycemia: Secondary | ICD-10-CM | POA: Diagnosis not present

## 2021-02-16 ENCOUNTER — Ambulatory Visit
Admission: RE | Admit: 2021-02-16 | Discharge: 2021-02-16 | Disposition: A | Discharge status: Home / Self Care | Payer: Medicare Other | Source: Ambulatory Visit | Attending: Nurse Practitioner | Admitting: Nurse Practitioner

## 2021-02-16 ENCOUNTER — Other Ambulatory Visit: Payer: Self-pay

## 2021-02-16 DIAGNOSIS — Z1231 Encounter for screening mammogram for malignant neoplasm of breast: Secondary | ICD-10-CM | POA: Insufficient documentation

## 2021-03-20 DIAGNOSIS — E119 Type 2 diabetes mellitus without complications: Secondary | ICD-10-CM | POA: Diagnosis not present

## 2021-03-21 DIAGNOSIS — J449 Chronic obstructive pulmonary disease, unspecified: Secondary | ICD-10-CM | POA: Diagnosis not present

## 2021-05-12 DIAGNOSIS — I10 Essential (primary) hypertension: Secondary | ICD-10-CM | POA: Diagnosis not present

## 2021-05-12 DIAGNOSIS — E039 Hypothyroidism, unspecified: Secondary | ICD-10-CM | POA: Diagnosis not present

## 2021-05-12 DIAGNOSIS — E785 Hyperlipidemia, unspecified: Secondary | ICD-10-CM | POA: Diagnosis not present

## 2021-05-12 DIAGNOSIS — E119 Type 2 diabetes mellitus without complications: Secondary | ICD-10-CM | POA: Diagnosis not present

## 2021-05-18 DIAGNOSIS — E782 Mixed hyperlipidemia: Secondary | ICD-10-CM | POA: Diagnosis not present

## 2021-05-18 DIAGNOSIS — E785 Hyperlipidemia, unspecified: Secondary | ICD-10-CM | POA: Diagnosis not present

## 2021-05-18 DIAGNOSIS — I10 Essential (primary) hypertension: Secondary | ICD-10-CM | POA: Diagnosis not present

## 2021-05-18 DIAGNOSIS — E1165 Type 2 diabetes mellitus with hyperglycemia: Secondary | ICD-10-CM | POA: Diagnosis not present

## 2021-05-18 DIAGNOSIS — E039 Hypothyroidism, unspecified: Secondary | ICD-10-CM | POA: Diagnosis not present

## 2021-05-18 DIAGNOSIS — J309 Allergic rhinitis, unspecified: Secondary | ICD-10-CM | POA: Diagnosis not present

## 2021-05-18 DIAGNOSIS — R7303 Prediabetes: Secondary | ICD-10-CM | POA: Diagnosis not present

## 2021-06-12 DIAGNOSIS — I10 Essential (primary) hypertension: Secondary | ICD-10-CM | POA: Diagnosis not present

## 2021-07-04 DIAGNOSIS — J449 Chronic obstructive pulmonary disease, unspecified: Secondary | ICD-10-CM | POA: Diagnosis not present

## 2021-08-25 DIAGNOSIS — E039 Hypothyroidism, unspecified: Secondary | ICD-10-CM | POA: Diagnosis not present

## 2021-08-25 DIAGNOSIS — I10 Essential (primary) hypertension: Secondary | ICD-10-CM | POA: Diagnosis not present

## 2021-08-25 DIAGNOSIS — E785 Hyperlipidemia, unspecified: Secondary | ICD-10-CM | POA: Diagnosis not present

## 2021-08-25 DIAGNOSIS — E1165 Type 2 diabetes mellitus with hyperglycemia: Secondary | ICD-10-CM | POA: Diagnosis not present

## 2021-09-04 DIAGNOSIS — E782 Mixed hyperlipidemia: Secondary | ICD-10-CM | POA: Diagnosis not present

## 2021-09-04 DIAGNOSIS — E1165 Type 2 diabetes mellitus with hyperglycemia: Secondary | ICD-10-CM | POA: Diagnosis not present

## 2021-09-04 DIAGNOSIS — I10 Essential (primary) hypertension: Secondary | ICD-10-CM | POA: Diagnosis not present

## 2021-09-04 DIAGNOSIS — E039 Hypothyroidism, unspecified: Secondary | ICD-10-CM | POA: Diagnosis not present

## 2021-12-15 DIAGNOSIS — E039 Hypothyroidism, unspecified: Secondary | ICD-10-CM | POA: Diagnosis not present

## 2021-12-15 DIAGNOSIS — E782 Mixed hyperlipidemia: Secondary | ICD-10-CM | POA: Diagnosis not present

## 2021-12-15 DIAGNOSIS — E1165 Type 2 diabetes mellitus with hyperglycemia: Secondary | ICD-10-CM | POA: Diagnosis not present

## 2021-12-15 DIAGNOSIS — I10 Essential (primary) hypertension: Secondary | ICD-10-CM | POA: Diagnosis not present

## 2021-12-19 ENCOUNTER — Other Ambulatory Visit: Payer: Self-pay | Admitting: Nurse Practitioner

## 2021-12-19 DIAGNOSIS — E782 Mixed hyperlipidemia: Secondary | ICD-10-CM | POA: Diagnosis not present

## 2021-12-19 DIAGNOSIS — J441 Chronic obstructive pulmonary disease with (acute) exacerbation: Secondary | ICD-10-CM | POA: Diagnosis not present

## 2021-12-19 DIAGNOSIS — Z1231 Encounter for screening mammogram for malignant neoplasm of breast: Secondary | ICD-10-CM

## 2021-12-19 DIAGNOSIS — J309 Allergic rhinitis, unspecified: Secondary | ICD-10-CM | POA: Diagnosis not present

## 2021-12-19 DIAGNOSIS — E1165 Type 2 diabetes mellitus with hyperglycemia: Secondary | ICD-10-CM | POA: Diagnosis not present

## 2022-01-04 DIAGNOSIS — J309 Allergic rhinitis, unspecified: Secondary | ICD-10-CM | POA: Diagnosis not present

## 2022-01-04 DIAGNOSIS — E782 Mixed hyperlipidemia: Secondary | ICD-10-CM | POA: Diagnosis not present

## 2022-01-04 DIAGNOSIS — I10 Essential (primary) hypertension: Secondary | ICD-10-CM | POA: Diagnosis not present

## 2022-01-04 DIAGNOSIS — J449 Chronic obstructive pulmonary disease, unspecified: Secondary | ICD-10-CM | POA: Diagnosis not present

## 2022-01-04 DIAGNOSIS — E039 Hypothyroidism, unspecified: Secondary | ICD-10-CM | POA: Diagnosis not present

## 2022-01-04 DIAGNOSIS — E1165 Type 2 diabetes mellitus with hyperglycemia: Secondary | ICD-10-CM | POA: Diagnosis not present

## 2022-04-18 DIAGNOSIS — I10 Essential (primary) hypertension: Secondary | ICD-10-CM | POA: Diagnosis not present

## 2022-04-18 DIAGNOSIS — E1165 Type 2 diabetes mellitus with hyperglycemia: Secondary | ICD-10-CM | POA: Diagnosis not present

## 2022-04-18 DIAGNOSIS — E782 Mixed hyperlipidemia: Secondary | ICD-10-CM | POA: Diagnosis not present

## 2022-04-18 DIAGNOSIS — E039 Hypothyroidism, unspecified: Secondary | ICD-10-CM | POA: Diagnosis not present

## 2022-05-10 DIAGNOSIS — E039 Hypothyroidism, unspecified: Secondary | ICD-10-CM | POA: Diagnosis not present

## 2022-05-10 DIAGNOSIS — I10 Essential (primary) hypertension: Secondary | ICD-10-CM | POA: Diagnosis not present

## 2022-05-10 DIAGNOSIS — E1165 Type 2 diabetes mellitus with hyperglycemia: Secondary | ICD-10-CM | POA: Diagnosis not present

## 2022-05-10 DIAGNOSIS — E782 Mixed hyperlipidemia: Secondary | ICD-10-CM | POA: Diagnosis not present

## 2022-05-10 DIAGNOSIS — Z23 Encounter for immunization: Secondary | ICD-10-CM | POA: Diagnosis not present

## 2022-05-10 DIAGNOSIS — Z0001 Encounter for general adult medical examination with abnormal findings: Secondary | ICD-10-CM | POA: Diagnosis not present

## 2022-06-12 DIAGNOSIS — E119 Type 2 diabetes mellitus without complications: Secondary | ICD-10-CM | POA: Diagnosis not present

## 2022-06-13 DIAGNOSIS — J449 Chronic obstructive pulmonary disease, unspecified: Secondary | ICD-10-CM | POA: Diagnosis not present

## 2022-06-13 DIAGNOSIS — R942 Abnormal results of pulmonary function studies: Secondary | ICD-10-CM | POA: Diagnosis not present

## 2022-06-13 DIAGNOSIS — J439 Emphysema, unspecified: Secondary | ICD-10-CM | POA: Diagnosis not present

## 2022-07-03 ENCOUNTER — Ambulatory Visit
Admission: RE | Admit: 2022-07-03 | Discharge: 2022-07-03 | Disposition: A | Discharge status: Home / Self Care | Payer: Medicare Other | Source: Ambulatory Visit | Attending: Nurse Practitioner | Admitting: Nurse Practitioner

## 2022-07-03 DIAGNOSIS — Z1231 Encounter for screening mammogram for malignant neoplasm of breast: Secondary | ICD-10-CM | POA: Diagnosis not present

## 2022-09-06 ENCOUNTER — Other Ambulatory Visit: Payer: Self-pay | Admitting: Nurse Practitioner

## 2022-09-06 ENCOUNTER — Other Ambulatory Visit: Payer: 59

## 2022-09-06 DIAGNOSIS — E119 Type 2 diabetes mellitus without complications: Secondary | ICD-10-CM

## 2022-09-06 DIAGNOSIS — E785 Hyperlipidemia, unspecified: Secondary | ICD-10-CM

## 2022-09-06 DIAGNOSIS — E039 Hypothyroidism, unspecified: Secondary | ICD-10-CM

## 2022-09-06 DIAGNOSIS — I10 Essential (primary) hypertension: Secondary | ICD-10-CM

## 2022-09-07 LAB — CBC WITH DIFFERENTIAL
Basophils Absolute: 0 10*3/uL (ref 0.0–0.2)
Basos: 1 %
EOS (ABSOLUTE): 0.3 10*3/uL (ref 0.0–0.4)
Eos: 4 %
Hematocrit: 38.5 % (ref 34.0–46.6)
Hemoglobin: 12.4 g/dL (ref 11.1–15.9)
Immature Grans (Abs): 0 10*3/uL (ref 0.0–0.1)
Immature Granulocytes: 0 %
Lymphocytes Absolute: 1.8 10*3/uL (ref 0.7–3.1)
Lymphs: 27 %
MCH: 25.9 pg — ABNORMAL LOW (ref 26.6–33.0)
MCHC: 32.2 g/dL (ref 31.5–35.7)
MCV: 81 fL (ref 79–97)
Monocytes Absolute: 0.8 10*3/uL (ref 0.1–0.9)
Monocytes: 12 %
Neutrophils Absolute: 3.9 10*3/uL (ref 1.4–7.0)
Neutrophils: 56 %
RBC: 4.78 x10E6/uL (ref 3.77–5.28)
RDW: 14.6 % (ref 11.7–15.4)
WBC: 6.8 10*3/uL (ref 3.4–10.8)

## 2022-09-07 LAB — LIPID PANEL
Chol/HDL Ratio: 2.6 ratio (ref 0.0–4.4)
Cholesterol, Total: 118 mg/dL (ref 100–199)
HDL: 45 mg/dL (ref >39)
LDL Chol Calc (NIH): 54 mg/dL (ref 0–99)
Triglycerides: 98 mg/dL (ref 0–149)
VLDL Cholesterol Cal: 19 mg/dL (ref 5–40)

## 2022-09-07 LAB — CMP14+EGFR
ALT: 15 IU/L (ref 0–32)
AST: 23 IU/L (ref 0–40)
Albumin/Globulin Ratio: 1.5 (ref 1.2–2.2)
Albumin: 4.4 g/dL (ref 3.9–4.9)
Alkaline Phosphatase: 70 IU/L (ref 44–121)
BUN/Creatinine Ratio: 11 — ABNORMAL LOW (ref 12–28)
BUN: 17 mg/dL (ref 8–27)
Bilirubin Total: 0.6 mg/dL (ref 0.0–1.2)
CO2: 22 mmol/L (ref 20–29)
Calcium: 10.3 mg/dL (ref 8.7–10.3)
Chloride: 103 mmol/L (ref 96–106)
Creatinine, Ser: 1.51 mg/dL — ABNORMAL HIGH (ref 0.57–1.00)
Globulin, Total: 3 g/dL (ref 1.5–4.5)
Glucose: 103 mg/dL — ABNORMAL HIGH (ref 70–99)
Potassium: 4.7 mmol/L (ref 3.5–5.2)
Sodium: 139 mmol/L (ref 134–144)
Total Protein: 7.4 g/dL (ref 6.0–8.5)
eGFR: 38 mL/min/{1.73_m2} — ABNORMAL LOW (ref >59)

## 2022-09-07 LAB — HEMOGLOBIN A1C
Est. average glucose Bld gHb Est-mCnc: 140 mg/dL
Hgb A1c MFr Bld: 6.5 % — ABNORMAL HIGH (ref 4.8–5.6)

## 2022-09-07 LAB — TSH: TSH: 0.943 u[IU]/mL (ref 0.450–4.500)

## 2022-09-11 ENCOUNTER — Other Ambulatory Visit: Payer: Self-pay | Admitting: Nurse Practitioner

## 2022-09-11 ENCOUNTER — Ambulatory Visit (INDEPENDENT_AMBULATORY_CARE_PROVIDER_SITE_OTHER): Payer: 59 | Admitting: Nurse Practitioner

## 2022-09-11 ENCOUNTER — Ambulatory Visit: Payer: 59 | Admitting: Nurse Practitioner

## 2022-09-11 VITALS — BP 112/70 | HR 87 | Ht 65.0 in | Wt 154.0 lb

## 2022-09-11 DIAGNOSIS — E782 Mixed hyperlipidemia: Secondary | ICD-10-CM | POA: Diagnosis not present

## 2022-09-11 DIAGNOSIS — R232 Flushing: Secondary | ICD-10-CM | POA: Diagnosis not present

## 2022-09-11 DIAGNOSIS — E119 Type 2 diabetes mellitus without complications: Secondary | ICD-10-CM | POA: Diagnosis not present

## 2022-09-11 DIAGNOSIS — E039 Hypothyroidism, unspecified: Secondary | ICD-10-CM | POA: Diagnosis not present

## 2022-09-11 DIAGNOSIS — N1832 Chronic kidney disease, stage 3b: Secondary | ICD-10-CM | POA: Insufficient documentation

## 2022-09-11 MED ORDER — GLIPIZIDE ER 10 MG PO TB24
10.0000 mg | ORAL_TABLET | Freq: Every day | ORAL | 5 refills | Status: DC
Start: 2022-09-11 — End: 2023-01-04

## 2022-09-11 MED ORDER — CLONIDINE HCL 0.1 MG PO TABS: 0.1000 mg | ORAL_TABLET | Freq: Every day | ORAL | 11 refills | Status: DC

## 2022-09-11 NOTE — Progress Notes (Signed)
Established Patient Office Visit  Subjective:  Patient ID: Kristin Greer, female    DOB: 1956-03-15  Age: 67 y.o. MRN: [id number]  No chief complaint on file.   4 month follow up and lab results, eGFR has dropped to 38 so will be d/c'ing metformin and meloxicam, recheck kidney function in 3-4 weeks.       Past Medical History:  Diagnosis Date   Anxiety    Arthritis    Bursitis    Constipation    Fibroids    Gout    Hemorrhoids    Hyperlipidemia    Hypertension    Hypothyroid    Migraine    Wears dentures    full upper and lower    Social History   Socioeconomic History   Marital status: Divorced    Spouse name: Not on file   Number of children: Not on file   Years of education: Not on file   Highest education level: Not on file  Occupational History   Not on file  Tobacco Use   Smoking status: Former    Packs/day: 0.50    Types: Cigarettes    Quit date: 09/28/2019    Years since quitting: 2.9   Smokeless tobacco: Never  Vaping Use   Vaping Use: Former  Substance and Sexual Activity   Alcohol use: Yes    Comment: rarely   Drug use: Never   Sexual activity: Not Currently    Birth control/protection: Post-menopausal  Other Topics Concern   Not on file  Social History Narrative   Not on file   Social Determinants of Health   Financial Resource Strain: Not on file  Food Insecurity: Not on file  Transportation Needs: Not on file  Physical Activity: Not on file  Stress: Not on file  Social Connections: Not on file  Intimate Partner Violence: Not on file    Family History  Problem Relation Age of Onset   Breast cancer Sister 36   Lupus Sister     No Known Allergies  Review of Systems  Constitutional: Negative.   HENT: Negative.    Eyes: Negative.   Respiratory: Negative.    Cardiovascular: Negative.   Gastrointestinal: Negative.   Genitourinary: Negative.   Musculoskeletal:  Positive for myalgias.  Skin: Negative.   Neurological:  Negative.   Endo/Heme/Allergies: Negative.   Psychiatric/Behavioral: Negative.         Objective:   There were no vitals taken for this visit.  There were no vitals filed for this visit.  Physical Exam Constitutional:      Appearance: Normal appearance.  HENT:     Head: Normocephalic.     Nose: Nose normal.     Mouth/Throat:     Mouth: Mucous membranes are moist.  Eyes:     Pupils: Pupils are equal, round, and reactive to light.  Cardiovascular:     Rate and Rhythm: Normal rate and regular rhythm.  Pulmonary:     Effort: Pulmonary effort is normal.     Breath sounds: Normal breath sounds.  Abdominal:     General: Bowel sounds are normal.     Palpations: Abdomen is soft.  Musculoskeletal:        General: Normal range of motion.     Cervical back: Neck supple.  Skin:    General: Skin is warm and dry.  Neurological:     Mental Status: She is alert and oriented to person, place, and time.  Psychiatric:  Mood and Affect: Mood normal.        Behavior: Behavior normal.      No results found for any visits on 09/11/22.  Recent Results (from the past 2160 hour(s))  CMP14+EGFR     Status: Abnormal   Collection Time: 09/06/22 10:05 AM  Result Value Ref Range   Glucose 103 (H) 70 - 99 mg/dL   BUN 17 8 - 27 mg/dL   Creatinine, Ser 1.51 (H) 0.57 - 1.00 mg/dL   eGFR 38 (L) >59 mL/min/1.73   BUN/Creatinine Ratio 11 (L) 12 - 28   Sodium 139 134 - 144 mmol/L   Potassium 4.7 3.5 - 5.2 mmol/L   Chloride 103 96 - 106 mmol/L   CO2 22 20 - 29 mmol/L   Calcium 10.3 8.7 - 10.3 mg/dL   Total Protein 7.4 6.0 - 8.5 g/dL   Albumin 4.4 3.9 - 4.9 g/dL   Globulin, Total 3.0 1.5 - 4.5 g/dL   Albumin/Globulin Ratio 1.5 1.2 - 2.2   Bilirubin Total 0.6 0.0 - 1.2 mg/dL   Alkaline Phosphatase 70 44 - 121 IU/L   AST 23 0 - 40 IU/L   ALT 15 0 - 32 IU/L  CBC With Differential     Status: Abnormal   Collection Time: 09/06/22 10:05 AM  Result Value Ref Range   WBC 6.8 3.4 - 10.8  x10E3/uL   RBC 4.78 3.77 - 5.28 x10E6/uL   Hemoglobin 12.4 11.1 - 15.9 g/dL   Hematocrit 38.5 34.0 - 46.6 %   MCV 81 79 - 97 fL   MCH 25.9 (L) 26.6 - 33.0 pg   MCHC 32.2 31.5 - 35.7 g/dL   RDW 14.6 11.7 - 15.4 %   Neutrophils 56 Not Estab. %   Lymphs 27 Not Estab. %   Monocytes 12 Not Estab. %   Eos 4 Not Estab. %   Basos 1 Not Estab. %   Neutrophils Absolute 3.9 1.4 - 7.0 x10E3/uL   Lymphocytes Absolute 1.8 0.7 - 3.1 x10E3/uL   Monocytes Absolute 0.8 0.1 - 0.9 x10E3/uL   EOS (ABSOLUTE) 0.3 0.0 - 0.4 x10E3/uL   Basophils Absolute 0.0 0.0 - 0.2 x10E3/uL   Immature Granulocytes 0 Not Estab. %   Immature Grans (Abs) 0.0 0.0 - 0.1 x10E3/uL  HgB A1c     Status: Abnormal   Collection Time: 09/06/22 10:05 AM  Result Value Ref Range   Hgb A1c MFr Bld 6.5 (H) 4.8 - 5.6 %    Comment:          Prediabetes: 5.7 - 6.4          Diabetes: >6.4          Glycemic control for adults with diabetes: <7.0    Est. average glucose Bld gHb Est-mCnc 140 mg/dL  Lipid Profile     Status: None   Collection Time: 09/06/22 10:05 AM  Result Value Ref Range   Cholesterol, Total 118 100 - 199 mg/dL   Triglycerides 98 0 - 149 mg/dL   HDL 45 >39 mg/dL   VLDL Cholesterol Cal 19 5 - 40 mg/dL   LDL Chol Calc (NIH) 54 0 - 99 mg/dL   Chol/HDL Ratio 2.6 0.0 - 4.4 ratio    Comment:                                   T. Chol/HDL Ratio  Men  Women                               1/2 Avg.Risk  3.4    3.3                                   Avg.Risk  5.0    4.4                                2X Avg.Risk  9.6    7.1                                3X Avg.Risk 23.4   11.0   TSH     Status: None   Collection Time: 09/06/22 10:05 AM  Result Value Ref Range   TSH 0.943 0.450 - 4.500 uIU/mL      Assessment & Plan:   Problem List Items Addressed This Visit       Endocrine   Diabetes mellitus without complication ([place]) - Primary   Relevant Medications   glipiZIDE (GLUCOTROL  XL) 10 MG 24 hr tablet   Hypothyroidism, adult     Genitourinary   Stage 3b chronic kidney disease ([place])     Other   Mixed hyperlipidemia    @VISPATINSTR$ @  No follow-ups on file.   Total time spent: 25 minutes  Evern Bio, NP  09/11/2022

## 2022-09-11 NOTE — Patient Instructions (Addendum)
1) Stop metformin and meloxicam due to lower eGFR 2) recheck eGFR in 3-4 weeks 3) Glipizide XL 10 mg daily 4) Strict dietary control 4) Followup appt in 3 months, fasting labs prior

## 2022-10-05 ENCOUNTER — Other Ambulatory Visit: Payer: 59

## 2022-10-05 DIAGNOSIS — N1832 Chronic kidney disease, stage 3b: Secondary | ICD-10-CM

## 2022-10-06 LAB — CMP14+EGFR
ALT: 16 IU/L (ref 0–32)
AST: 24 IU/L (ref 0–40)
Albumin/Globulin Ratio: 1.3 (ref 1.2–2.2)
Albumin: 4.3 g/dL (ref 3.9–4.9)
Alkaline Phosphatase: 66 IU/L (ref 44–121)
BUN/Creatinine Ratio: 10 — ABNORMAL LOW (ref 12–28)
BUN: 12 mg/dL (ref 8–27)
Bilirubin Total: 0.6 mg/dL (ref 0.0–1.2)
CO2: 23 mmol/L (ref 20–29)
Calcium: 10.3 mg/dL (ref 8.7–10.3)
Chloride: 109 mmol/L — ABNORMAL HIGH (ref 96–106)
Creatinine, Ser: 1.24 mg/dL — ABNORMAL HIGH (ref 0.57–1.00)
Globulin, Total: 3.2 g/dL (ref 1.5–4.5)
Glucose: 92 mg/dL (ref 70–99)
Potassium: 4 mmol/L (ref 3.5–5.2)
Sodium: 148 mmol/L — ABNORMAL HIGH (ref 134–144)
Total Protein: 7.5 g/dL (ref 6.0–8.5)
eGFR: 48 mL/min/{1.73_m2} — ABNORMAL LOW (ref >59)

## 2022-10-09 ENCOUNTER — Other Ambulatory Visit: Payer: Self-pay

## 2022-10-10 MED ORDER — LOSARTAN POTASSIUM 100 MG PO TABS: 100.0000 mg | ORAL_TABLET | Freq: Every day | ORAL | 11 refills | Status: DC

## 2022-10-10 MED ORDER — AMLODIPINE BESYLATE 5 MG PO TABS: 5.0000 mg | ORAL_TABLET | Freq: Every day | ORAL | 11 refills | Status: DC

## 2022-10-29 ENCOUNTER — Ambulatory Visit: Payer: 59

## 2022-10-29 ENCOUNTER — Telehealth: Payer: Self-pay

## 2022-10-29 ENCOUNTER — Other Ambulatory Visit: Payer: Self-pay | Admitting: Nurse Practitioner

## 2022-10-29 DIAGNOSIS — J069 Acute upper respiratory infection, unspecified: Secondary | ICD-10-CM

## 2022-10-29 MED ORDER — AMOXICILLIN-POT CLAVULANATE 875-125 MG PO TABS: 1.0000 | ORAL_TABLET | Freq: Two times a day (BID) | ORAL | 0 refills | Status: DC

## 2022-10-29 NOTE — Telephone Encounter (Signed)
Patient called stating that she has a cough and congested, slight fever, she is requesting to get something sent to the pharmacy for her

## 2022-10-30 ENCOUNTER — Telehealth: Payer: Self-pay

## 2022-10-30 ENCOUNTER — Other Ambulatory Visit: Payer: Self-pay | Admitting: Nurse Practitioner

## 2022-10-30 MED ORDER — PAXLOVID (300/100) 20 X 150 MG & 10 X 100MG PO TBPK: 3.0000 | ORAL_TABLET | Freq: Two times a day (BID) | ORAL | 0 refills | Status: DC

## 2022-10-30 NOTE — Telephone Encounter (Signed)
Sent in paxlovid

## 2022-10-30 NOTE — Telephone Encounter (Signed)
Pt called and left vm requesting a call back regarding that she tested positive for covid yesterday, has been taking otc things asked if there's anything that can be called in for her to [facility]

## 2022-11-01 ENCOUNTER — Other Ambulatory Visit: Payer: Self-pay

## 2022-11-01 MED ORDER — PAXLOVID (300/100) 20 X 150 MG & 10 X 100MG PO TBPK: 3.0000 | ORAL_TABLET | Freq: Two times a day (BID) | ORAL | 0 refills | Status: DC

## 2022-11-07 ENCOUNTER — Telehealth: Payer: Self-pay

## 2022-11-07 MED ORDER — BENZONATATE 100 MG PO CAPS: 100.0000 mg | ORAL_CAPSULE | Freq: Three times a day (TID) | ORAL | 0 refills | Status: DC | PRN

## 2022-11-07 NOTE — Telephone Encounter (Signed)
Pt called and left vm requesting a call back regarding that she finished rx paxlovid & is still having cough/congestion & asked if Chelsa can call in something for cough and something for congestion

## 2022-11-07 NOTE — Addendum Note (Signed)
Addended by: Grayling Congress on: 11/07/2022 12:49 PM   Modules accepted: Orders

## 2022-11-19 ENCOUNTER — Other Ambulatory Visit: Payer: Self-pay | Admitting: Nurse Practitioner

## 2022-11-19 ENCOUNTER — Telehealth: Payer: Self-pay

## 2022-11-19 MED ORDER — LEVOFLOXACIN 500 MG PO TABS: 500.0000 mg | ORAL_TABLET | Freq: Every day | ORAL | 0 refills | Status: AC

## 2022-11-19 NOTE — Telephone Encounter (Signed)
Ok she had high dose augmentin but sent in Levaquin for 7 days.  She needs an appt if this one doesn't help thx

## 2022-11-19 NOTE — Telephone Encounter (Signed)
Patient called stating that she is still not feeling any better since having covid a few weeks ago she is requesting to get some thing called in to help her feel better

## 2022-12-03 ENCOUNTER — Telehealth: Payer: Self-pay

## 2022-12-03 NOTE — Telephone Encounter (Signed)
Dawn with [facility] called stating that patient informed her that she was no longer taking the metformin that it was D*/C  please let me know if this is the case

## 2022-12-04 ENCOUNTER — Other Ambulatory Visit: Payer: 59

## 2022-12-04 ENCOUNTER — Telehealth: Payer: Self-pay

## 2022-12-04 DIAGNOSIS — E039 Hypothyroidism, unspecified: Secondary | ICD-10-CM

## 2022-12-04 DIAGNOSIS — I10 Essential (primary) hypertension: Secondary | ICD-10-CM | POA: Diagnosis not present

## 2022-12-04 DIAGNOSIS — E119 Type 2 diabetes mellitus without complications: Secondary | ICD-10-CM

## 2022-12-04 DIAGNOSIS — E782 Mixed hyperlipidemia: Secondary | ICD-10-CM

## 2022-12-04 NOTE — Telephone Encounter (Signed)
Patient was in office getting labs and she states she's got arthritis and is requesting to get something called in for her please advise

## 2022-12-05 ENCOUNTER — Other Ambulatory Visit: Payer: Self-pay

## 2022-12-05 LAB — CMP14+EGFR
ALT: 11 IU/L (ref 0–32)
AST: 15 IU/L (ref 0–40)
Albumin/Globulin Ratio: 1.6 (ref 1.2–2.2)
Albumin: 4.4 g/dL (ref 3.9–4.9)
Alkaline Phosphatase: 63 IU/L (ref 44–121)
BUN/Creatinine Ratio: 14 (ref 12–28)
BUN: 16 mg/dL (ref 8–27)
Bilirubin Total: 0.6 mg/dL (ref 0.0–1.2)
CO2: 23 mmol/L (ref 20–29)
Calcium: 10 mg/dL (ref 8.7–10.3)
Chloride: 102 mmol/L (ref 96–106)
Creatinine, Ser: 1.15 mg/dL — ABNORMAL HIGH (ref 0.57–1.00)
Globulin, Total: 2.7 g/dL (ref 1.5–4.5)
Glucose: 91 mg/dL (ref 70–99)
Potassium: 4.1 mmol/L (ref 3.5–5.2)
Sodium: 141 mmol/L (ref 134–144)
Total Protein: 7.1 g/dL (ref 6.0–8.5)
eGFR: 52 mL/min/{1.73_m2} — ABNORMAL LOW (ref >59)

## 2022-12-05 LAB — HEMOGLOBIN A1C
Est. average glucose Bld gHb Est-mCnc: 134 mg/dL
Hgb A1c MFr Bld: 6.3 % — ABNORMAL HIGH (ref 4.8–5.6)

## 2022-12-05 LAB — CBC WITH DIFFERENTIAL
Basophils Absolute: 0 10*3/uL (ref 0.0–0.2)
Basos: 1 %
EOS (ABSOLUTE): 0.2 10*3/uL (ref 0.0–0.4)
Eos: 2 %
Hematocrit: 35.3 % (ref 34.0–46.6)
Hemoglobin: 11.3 g/dL (ref 11.1–15.9)
Immature Grans (Abs): 0 10*3/uL (ref 0.0–0.1)
Immature Granulocytes: 0 %
Lymphocytes Absolute: 1.7 10*3/uL (ref 0.7–3.1)
Lymphs: 20 %
MCH: 25.9 pg — ABNORMAL LOW (ref 26.6–33.0)
MCHC: 32 g/dL (ref 31.5–35.7)
MCV: 81 fL (ref 79–97)
Monocytes Absolute: 0.8 10*3/uL (ref 0.1–0.9)
Monocytes: 9 %
Neutrophils Absolute: 5.5 10*3/uL (ref 1.4–7.0)
Neutrophils: 68 %
RBC: 4.36 x10E6/uL (ref 3.77–5.28)
RDW: 15 % (ref 11.7–15.4)
WBC: 8.1 10*3/uL (ref 3.4–10.8)

## 2022-12-05 LAB — LIPID PANEL
Chol/HDL Ratio: 2.5 ratio (ref 0.0–4.4)
Cholesterol, Total: 113 mg/dL (ref 100–199)
HDL: 46 mg/dL (ref >39)
LDL Chol Calc (NIH): 48 mg/dL (ref 0–99)
Triglycerides: 99 mg/dL (ref 0–149)
VLDL Cholesterol Cal: 19 mg/dL (ref 5–40)

## 2022-12-05 LAB — TSH: TSH: 1.66 u[IU]/mL (ref 0.450–4.500)

## 2022-12-10 ENCOUNTER — Encounter: Payer: Self-pay | Admitting: Nurse Practitioner

## 2022-12-10 ENCOUNTER — Ambulatory Visit (INDEPENDENT_AMBULATORY_CARE_PROVIDER_SITE_OTHER): Payer: 59 | Admitting: Nurse Practitioner

## 2022-12-10 VITALS — BP 110/70 | HR 99 | Ht 65.0 in | Wt 157.2 lb

## 2022-12-10 DIAGNOSIS — E782 Mixed hyperlipidemia: Secondary | ICD-10-CM | POA: Diagnosis not present

## 2022-12-10 DIAGNOSIS — M1990 Unspecified osteoarthritis, unspecified site: Secondary | ICD-10-CM

## 2022-12-10 DIAGNOSIS — E039 Hypothyroidism, unspecified: Secondary | ICD-10-CM | POA: Diagnosis not present

## 2022-12-10 DIAGNOSIS — E119 Type 2 diabetes mellitus without complications: Secondary | ICD-10-CM

## 2022-12-10 NOTE — Progress Notes (Signed)
Established Patient Office Visit  Subjective:  Patient ID: Kristin Greer, female    DOB: February 21, 1956  Age: 67 y.o. MRN: [id number]  Chief Complaint  Patient presents with   Follow-up    3 month lab results    Follow up and review of recent lab results.  Patient had COVID last week.  Feeling fatigued.  C/o arthritis, due to kidney function, cannot prescribe meloxicam, pt will take acetaminophen every 8 hours to help.  LDL is at goal, TSH is stable, A1c is at 6.3%.     No other concerns at this time.   Past Medical History:  Diagnosis Date   Anxiety    Arthritis    Bursitis    Constipation    Fibroids    Gout    Hemorrhoids    Hyperlipidemia    Hypertension    Hypothyroid    Migraine    Wears dentures    full upper and lower    Past Surgical History:  Procedure Laterality Date   CATARACT EXTRACTION W/PHACO Left 07/07/2020   Procedure: CATARACT EXTRACTION PHACO AND INTRAOCULAR LENS PLACEMENT (IOC) LEFT DIABETIC 4.12 00:34.6;  Surgeon: Galen Manila, MD;  Location: [facility];  Service: Ophthalmology;  Laterality: Left;  Diabetic - oral meds   CATARACT EXTRACTION W/PHACO Right 08/02/2020   Procedure: CATARACT EXTRACTION PHACO AND INTRAOCULAR LENS PLACEMENT (IOC) RIGHT DIABETIC 4.25 00:34.5;  Surgeon: Galen Manila, MD;  Location: [facility];  Service: Ophthalmology;  Laterality: Right;   DG TEETH FULL     all teeth pulled has partials   LAPAROSCOPIC SUPRACERVICAL HYSTERECTOMY  2012   turmors removed     one in [employer] yrs ago; 5-6 removed 2015 [facility]    Social History   Socioeconomic History   Marital status: Divorced    Spouse name: Not on file   Number of children: Not on file   Years of education: Not on file   Highest education level: Not on file  Occupational History   Not on file  Tobacco Use   Smoking status: Former    Packs/day: .5    Types: Cigarettes    Quit date: 09/28/2019    Years since quitting: 3.2   Smokeless tobacco:  Never  Vaping Use   Vaping Use: Former  Substance and Sexual Activity   Alcohol use: Yes    Comment: rarely   Drug use: Never   Sexual activity: Not Currently    Birth control/protection: Post-menopausal  Other Topics Concern   Not on file  Social History Narrative   Not on file   Social Determinants of Health   Financial Resource Strain: Not on file  Food Insecurity: Not on file  Transportation Needs: Not on file  Physical Activity: Not on file  Stress: Not on file  Social Connections: Not on file  Intimate Partner Violence: Not on file    Family History  Problem Relation Age of Onset   Breast cancer Sister 4   Lupus Sister     No Known Allergies  Review of Systems  Constitutional: Negative.   HENT: Negative.    Eyes: Negative.   Respiratory: Negative.    Cardiovascular: Negative.   Gastrointestinal: Negative.   Genitourinary: Negative.   Musculoskeletal:  Positive for joint pain.  Skin: Negative.   Neurological: Negative.   Endo/Heme/Allergies: Negative.   Psychiatric/Behavioral:  The patient is nervous/anxious.        Objective:   There were no vitals taken for this  visit.  There were no vitals filed for this visit.  Physical Exam Vitals reviewed.  Constitutional:      Appearance: Normal appearance.  HENT:     Head: Normocephalic.     Nose: Nose normal.     Mouth/Throat:     Mouth: Mucous membranes are moist.  Eyes:     Pupils: Pupils are equal, round, and reactive to light.  Cardiovascular:     Rate and Rhythm: Normal rate and regular rhythm.  Pulmonary:     Effort: Pulmonary effort is normal.     Breath sounds: Normal breath sounds.  Abdominal:     General: Bowel sounds are normal.     Palpations: Abdomen is soft.  Musculoskeletal:        General: Tenderness present.     Cervical back: Normal range of motion and neck supple.  Skin:    General: Skin is warm and dry.  Neurological:     Mental Status: She is alert and oriented to  person, place, and time.  Psychiatric:        Mood and Affect: Mood normal.        Behavior: Behavior normal.      No results found for any visits on 12/10/22.  Recent Results (from the past 2160 hour(s))  CMP14+EGFR     Status: Abnormal   Collection Time: 10/05/22 10:14 AM  Result Value Ref Range   Glucose 92 70 - 99 mg/dL   BUN 12 8 - 27 mg/dL   Creatinine, Ser 1.30 (H) 0.57 - 1.00 mg/dL   eGFR 48 (L) >86 VH/QIO/9.62   BUN/Creatinine Ratio 10 (L) 12 - 28   Sodium 148 (H) 134 - 144 mmol/L   Potassium 4.0 3.5 - 5.2 mmol/L   Chloride 109 (H) 96 - 106 mmol/L   CO2 23 20 - 29 mmol/L   Calcium 10.3 8.7 - 10.3 mg/dL   Total Protein 7.5 6.0 - 8.5 g/dL   Albumin 4.3 3.9 - 4.9 g/dL   Globulin, Total 3.2 1.5 - 4.5 g/dL   Albumin/Globulin Ratio 1.3 1.2 - 2.2   Bilirubin Total 0.6 0.0 - 1.2 mg/dL   Alkaline Phosphatase 66 44 - 121 IU/L   AST 24 0 - 40 IU/L   ALT 16 0 - 32 IU/L  Hemoglobin A1c     Status: Abnormal   Collection Time: 12/04/22 10:03 AM  Result Value Ref Range   Hgb A1c MFr Bld 6.3 (H) 4.8 - 5.6 %    Comment:          Prediabetes: 5.7 - 6.4          Diabetes: >6.4          Glycemic control for adults with diabetes: <7.0    Est. average glucose Bld gHb Est-mCnc 134 mg/dL  TSH     Status: None   Collection Time: 12/04/22 10:03 AM  Result Value Ref Range   TSH 1.660 0.450 - 4.500 uIU/mL  CMP14+EGFR     Status: Abnormal   Collection Time: 12/04/22 10:03 AM  Result Value Ref Range   Glucose 91 70 - 99 mg/dL   BUN 16 8 - 27 mg/dL   Creatinine, Ser 9.52 (H) 0.57 - 1.00 mg/dL   eGFR 52 (L) >84 XL/KGM/0.10   BUN/Creatinine Ratio 14 12 - 28   Sodium 141 134 - 144 mmol/L   Potassium 4.1 3.5 - 5.2 mmol/L   Chloride 102 96 - 106 mmol/L   CO2 23 20 -  29 mmol/L   Calcium 10.0 8.7 - 10.3 mg/dL   Total Protein 7.1 6.0 - 8.5 g/dL   Albumin 4.4 3.9 - 4.9 g/dL   Globulin, Total 2.7 1.5 - 4.5 g/dL   Albumin/Globulin Ratio 1.6 1.2 - 2.2   Bilirubin Total 0.6 0.0 - 1.2 mg/dL    Alkaline Phosphatase 63 44 - 121 IU/L   AST 15 0 - 40 IU/L   ALT 11 0 - 32 IU/L  Lipid panel     Status: None   Collection Time: 12/04/22 10:03 AM  Result Value Ref Range   Cholesterol, Total 113 100 - 199 mg/dL   Triglycerides 99 0 - 149 mg/dL   HDL 46 >16 mg/dL   VLDL Cholesterol Cal 19 5 - 40 mg/dL   LDL Chol Calc (NIH) 48 0 - 99 mg/dL   Chol/HDL Ratio 2.5 0.0 - 4.4 ratio    Comment:                                   T. Chol/HDL Ratio                                             Men  Women                               1/2 Avg.Risk  3.4    3.3                                   Avg.Risk  5.0    4.4                                2X Avg.Risk  9.6    7.1                                3X Avg.Risk 23.4   11.0   CBC With Differential     Status: Abnormal   Collection Time: 12/04/22 10:03 AM  Result Value Ref Range   WBC 8.1 3.4 - 10.8 x10E3/uL   RBC 4.36 3.77 - 5.28 x10E6/uL   Hemoglobin 11.3 11.1 - 15.9 g/dL   Hematocrit 10.9 60.4 - 46.6 %   MCV 81 79 - 97 fL   MCH 25.9 (L) 26.6 - 33.0 pg   MCHC 32.0 31.5 - 35.7 g/dL   RDW 54.0 98.1 - 19.1 %   Neutrophils 68 Not Estab. %   Lymphs 20 Not Estab. %   Monocytes 9 Not Estab. %   Eos 2 Not Estab. %   Basos 1 Not Estab. %   Neutrophils Absolute 5.5 1.4 - 7.0 x10E3/uL   Lymphocytes Absolute 1.7 0.7 - 3.1 x10E3/uL   Monocytes Absolute 0.8 0.1 - 0.9 x10E3/uL   EOS (ABSOLUTE) 0.2 0.0 - 0.4 x10E3/uL   Basophils Absolute 0.0 0.0 - 0.2 x10E3/uL   Immature Granulocytes 0 Not Estab. %   Immature Grans (Abs) 0.0 0.0 - 0.1 x10E3/uL      Assessment & Plan:   Problem List Items Addressed This Visit  None   No follow-ups on file.   Total time spent: 35 minutes  Orson Eva, NP  12/10/2022

## 2022-12-10 NOTE — Patient Instructions (Signed)
1) Tylenol for arthritis pain 2) Cut back on sugars and fruits 3) Follow up appt in 4 months, fasting labs prior if possible

## 2022-12-12 ENCOUNTER — Telehealth: Payer: Self-pay

## 2022-12-12 NOTE — Telephone Encounter (Signed)
Pt called and left vm regarding needing a referral from [place], to [facility]- pt gave their phone number: [phone]. Please advise

## 2022-12-13 ENCOUNTER — Other Ambulatory Visit: Payer: Self-pay | Admitting: Nurse Practitioner

## 2022-12-13 DIAGNOSIS — I8393 Asymptomatic varicose veins of bilateral lower extremities: Secondary | ICD-10-CM

## 2022-12-17 IMAGING — MG MM DIGITAL SCREENING BILAT W/ TOMO AND CAD
8 series · 8 of 24 positions shown · non-contrast
Comparison: Previous exam(s).

CLINICAL DATA: Screening.

EXAM:
DIGITAL SCREENING BILATERAL MAMMOGRAM WITH TOMOSYNTHESIS AND CAD
TECHNIQUE: Bilateral screening digital craniocaudal and mediolateral oblique
mammograms were obtained. Bilateral screening digital breast
tomosynthesis was performed. The images were evaluated with
computer-aided detection.

[L CC synth-2D]
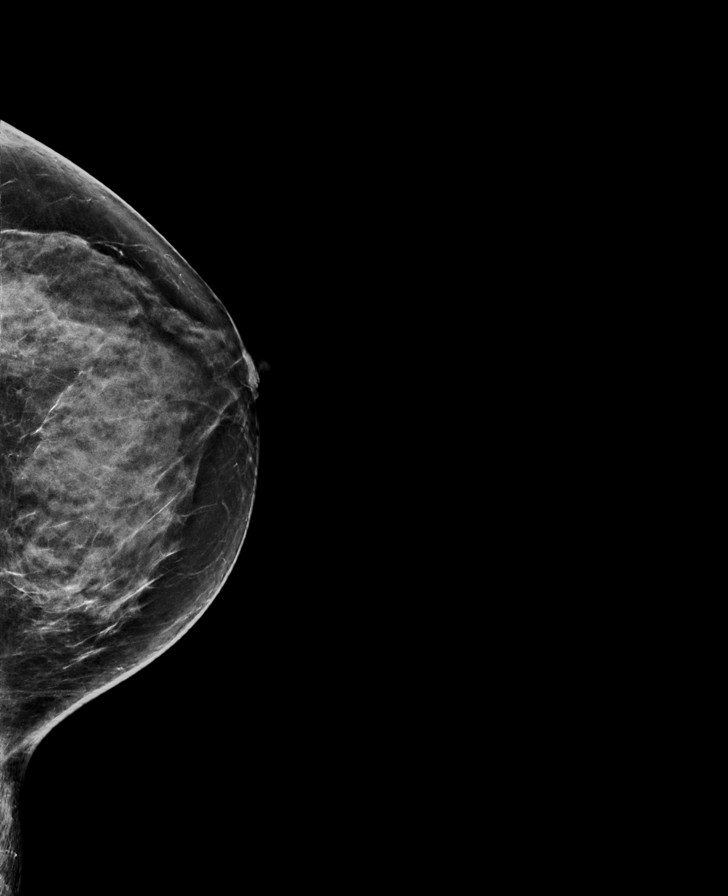

[L MLO synth-2D]
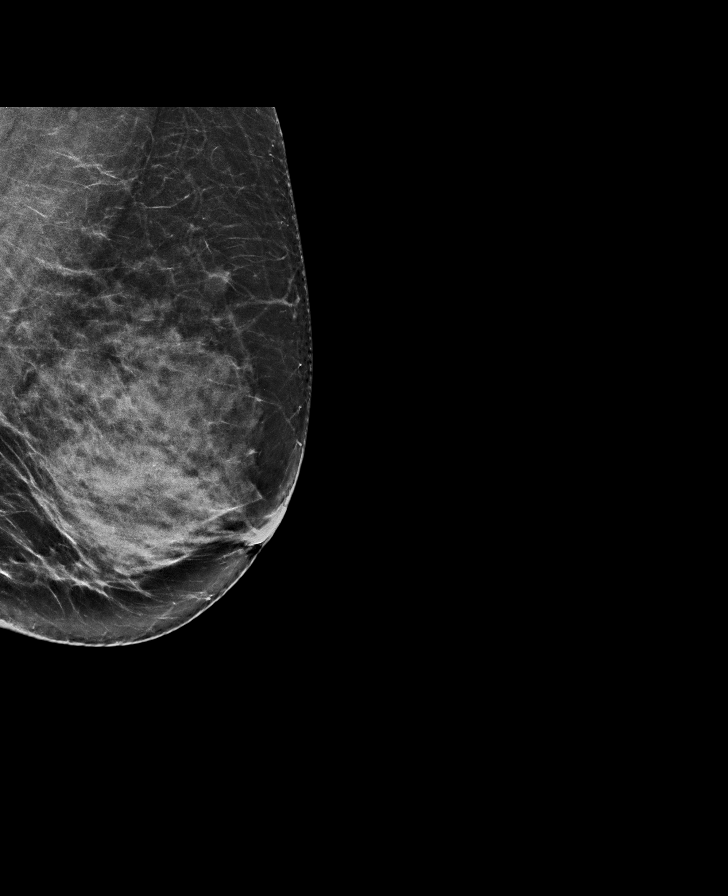

[R MLO synth-2D]
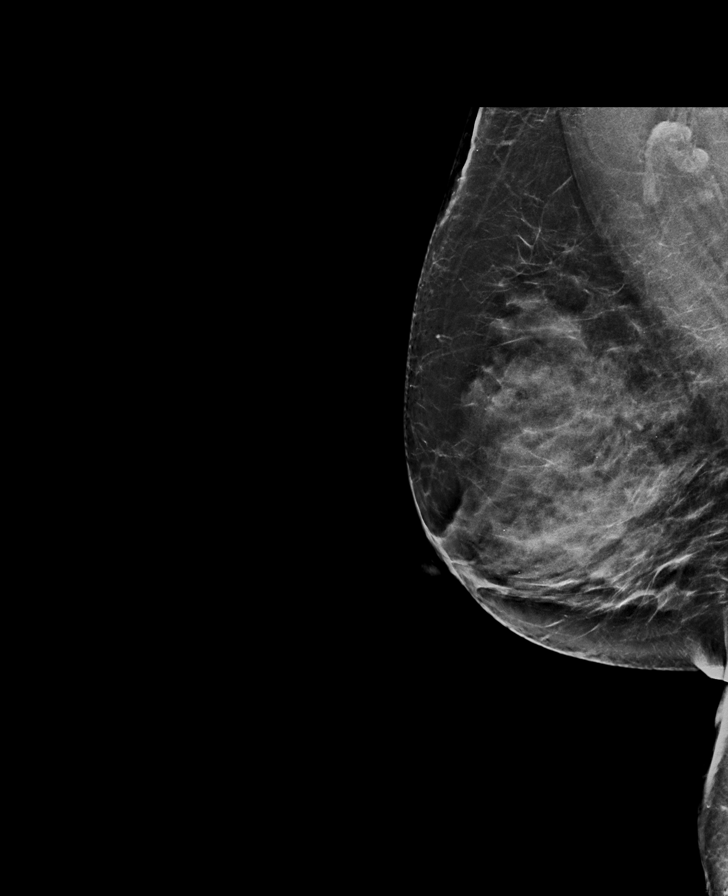

[R CC synth-2D]
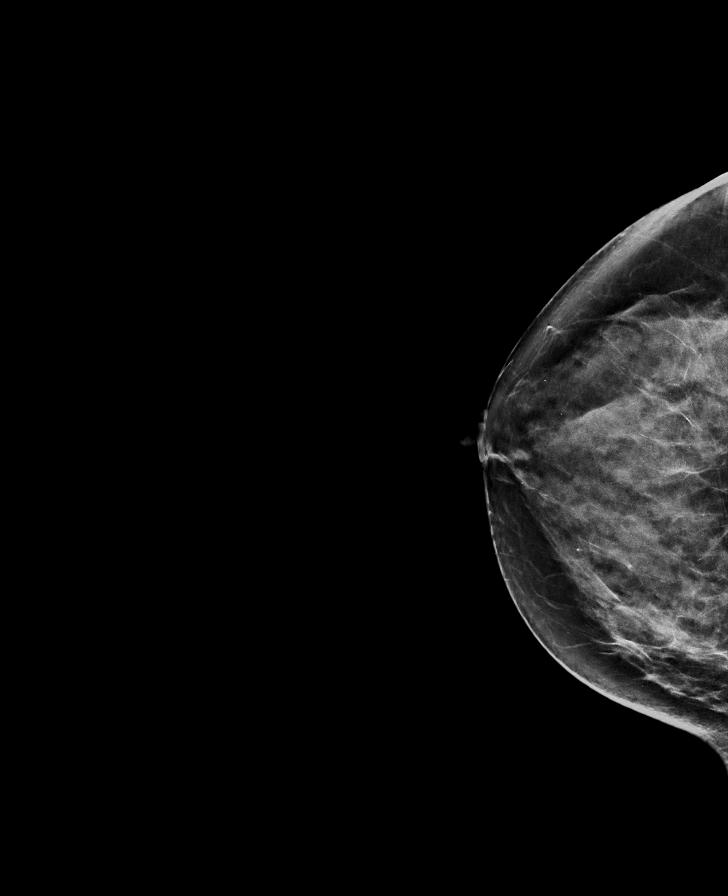

[R CC tomo · tomo slice 37/72.0]
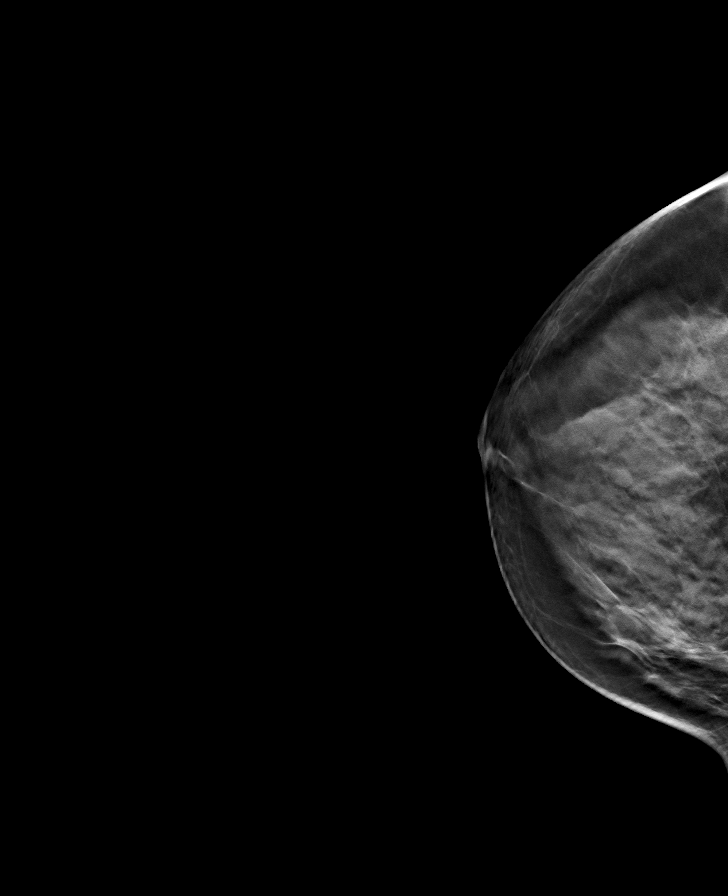

[L MLO tomo · tomo slice 35/70.0]
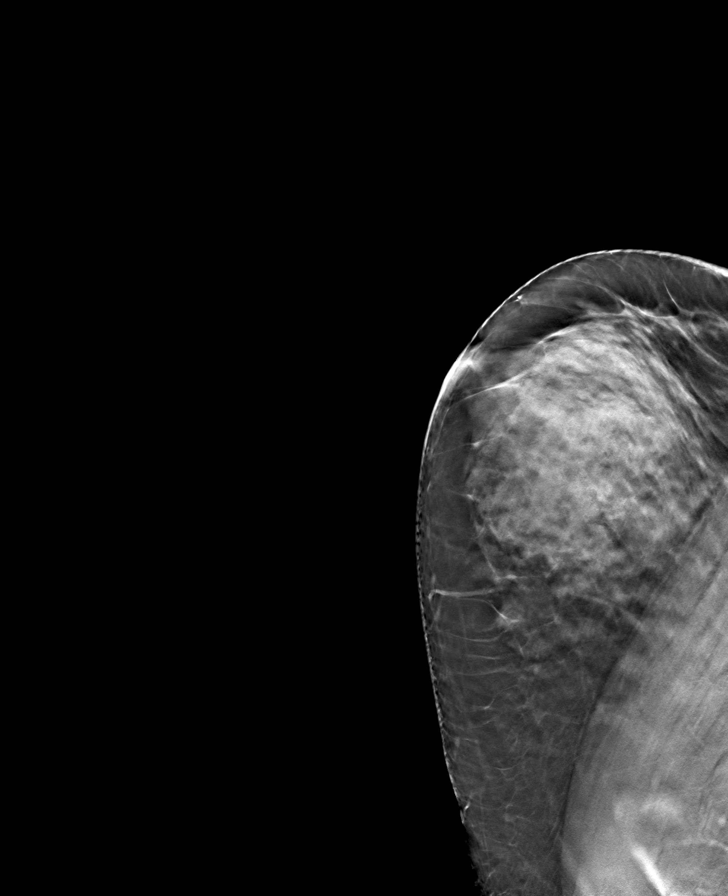

[R MLO tomo · tomo slice 36/71.0]
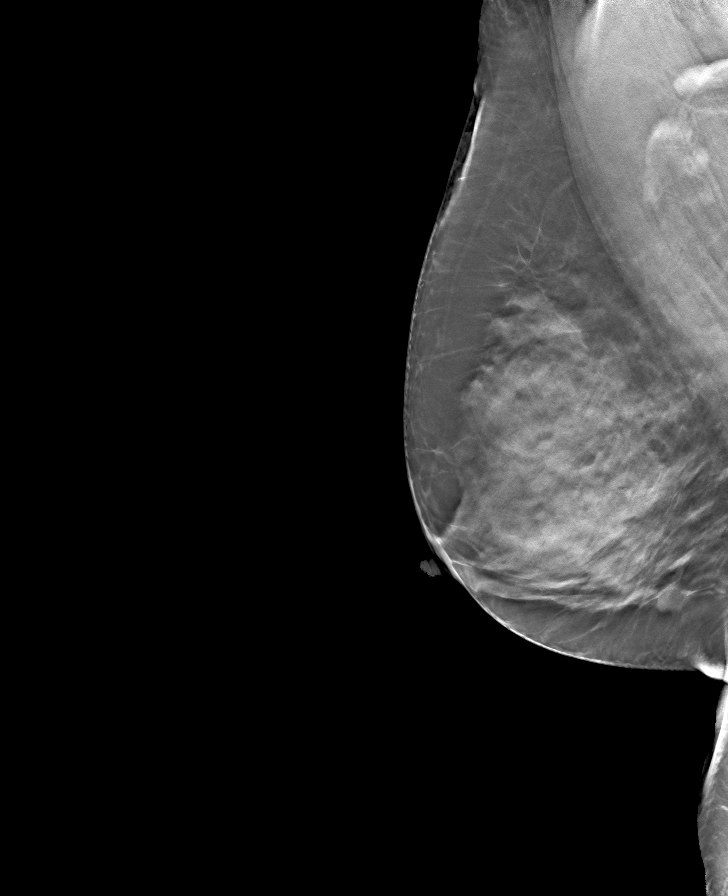

[L CC tomo · tomo slice 35/68.0]
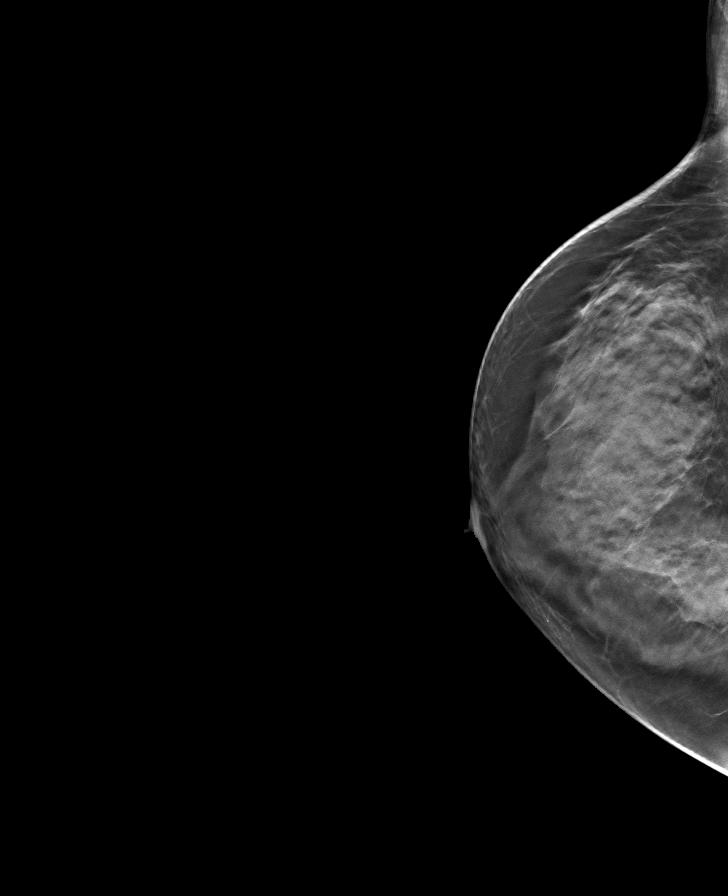

[8 of 24 positions shown; findings below may reference images not displayed]

ACR Breast Density Category d: The breast tissue is extremely dense,
which lowers the sensitivity of mammography
FINDINGS: There are no findings suspicious for malignancy.
IMPRESSION: No mammographic evidence of malignancy. A result letter of this
screening mammogram will be mailed directly to the patient.

RECOMMENDATION:
Screening mammogram in one year. (Code:TA-V-WV9)

BI-RADS CATEGORY  1: Negative.

## 2023-01-04 ENCOUNTER — Ambulatory Visit (INDEPENDENT_AMBULATORY_CARE_PROVIDER_SITE_OTHER): Payer: 59 | Admitting: Vascular Surgery

## 2023-01-04 VITALS — Resp 19 | Ht 63.0 in | Wt 156.8 lb

## 2023-01-04 DIAGNOSIS — E782 Mixed hyperlipidemia: Secondary | ICD-10-CM | POA: Diagnosis not present

## 2023-01-04 DIAGNOSIS — N1832 Chronic kidney disease, stage 3b: Secondary | ICD-10-CM | POA: Diagnosis not present

## 2023-01-04 DIAGNOSIS — I83813 Varicose veins of bilateral lower extremities with pain: Secondary | ICD-10-CM | POA: Diagnosis not present

## 2023-01-04 NOTE — Assessment & Plan Note (Signed)
Could be contributing to lower extremity swelling.

## 2023-01-04 NOTE — Progress Notes (Signed)
Patient ID: Kristin Greer, female   DOB: 1956-05-13, 67 y.o.   MRN: [id number]  Chief Complaint  Patient presents with   Establish Care    HPI Kristin Greer is a 67 y.o. female.  I am asked to see the patient by Meryl Crutch for evaluation of pain and swelling of the lower extremities associated with some varicose veins.  Her right leg has swelling most of the time.  Her left leg does not swell much, but it does ache particularly in the calf and lower leg.  Both legs have prominent varicosities bilaterally.  She denies any known history of DVT or superficial thrombophlebitis to her knowledge.  No chest pain or shortness of breath.  She tries to elevate her legs.  She has not yet worn compression socks.  She has some arthritic pain in her legs as well..     Past Medical History:  Diagnosis Date   Anxiety    Arthritis    Bursitis    Constipation    Fibroids    Gout    Hemorrhoids    Hyperlipidemia    Hypertension    Hypothyroid    Migraine    Wears dentures    full upper and lower    Past Surgical History:  Procedure Laterality Date   CATARACT EXTRACTION W/PHACO Left 07/07/2020   Procedure: CATARACT EXTRACTION PHACO AND INTRAOCULAR LENS PLACEMENT (IOC) LEFT DIABETIC 4.12 00:34.6;  Surgeon: Galen Manila, MD;  Location: [facility];  Service: Ophthalmology;  Laterality: Left;  Diabetic - oral meds   CATARACT EXTRACTION W/PHACO Right 08/02/2020   Procedure: CATARACT EXTRACTION PHACO AND INTRAOCULAR LENS PLACEMENT (IOC) RIGHT DIABETIC 4.25 00:34.5;  Surgeon: Galen Manila, MD;  Location: [facility];  Service: Ophthalmology;  Laterality: Right;   DG TEETH FULL     all teeth pulled has partials   LAPAROSCOPIC SUPRACERVICAL HYSTERECTOMY  2012   turmors removed     one in [employer] yrs ago; 5-6 removed 2015 [facility]     Family History  Problem Relation Age of Onset   Breast cancer Sister 48   Lupus Sister   No bleeding or clotting disorders   Social  History   Tobacco Use   Smoking status: Former    Packs/day: .5    Types: Cigarettes    Quit date: 09/28/2019    Years since quitting: 3.2   Smokeless tobacco: Never  Vaping Use   Vaping Use: Former  Substance Use Topics   Alcohol use: Yes    Comment: rarely   Drug use: Never     Allergies  Allergen Reactions   Varenicline     Note: patient sleepwalks    Current Outpatient Medications  Medication Sig Dispense Refill   acetaminophen (TYLENOL) 650 MG CR tablet Take 1,300 mg by mouth every 8 (eight) hours as needed for pain.     albuterol (VENTOLIN HFA) 108 (90 Base) MCG/ACT inhaler Inhale 2 puffs into the lungs every 6 (six) hours as needed.     amLODipine (NORVASC) 5 MG tablet Take 1 tablet (5 mg total) by mouth daily. 30 tablet 11   aspirin EC 81 MG tablet Take 1 tablet by mouth daily.     aspirin-acetaminophen-caffeine (EXCEDRIN MIGRAINE) 250-250-65 MG tablet Take 2 tablets by mouth every 6 (six) hours as needed for headache.     atorvastatin (LIPITOR) 40 MG tablet Take 40 mg by mouth daily.     Calcium Carbonate-Vit D-Min (CALTRATE 600+D PLUS  MINERALS) 600-800 MG-UNIT CHEW Caltrate 600+D Plus Minerals 600-800 MG-UNIT Oral Tablet Chewable QTY: 0 tablet Days: 0 Refills: 0  Written: 06/04/18 Patient Instructions: qd     cetirizine (ZYRTEC) 10 MG tablet Take 1 tablet by mouth daily.     fluticasone (FLONASE) 50 MCG/ACT nasal spray Place 1 spray into both nostrils daily.     gabapentin (NEURONTIN) 100 MG capsule Take 1 capsule by mouth daily.     levothyroxine (SYNTHROID) 50 MCG tablet TAKE 1 TABLET BY  MOUTH EVERY MORNING ONAN EMPTY STOMACH 1 HOUR PRIOR TO BREAKFAST 30 tablet 5   losartan (COZAAR) 100 MG tablet Take 1 tablet (100 mg total) by mouth daily. 30 tablet 11   montelukast (SINGULAIR) 10 MG tablet Take 1 tablet by mouth daily.     NICOTINE POLACRILEX MT Use as directed 4 mg in the mouth or throat daily.     Omega-3 Fatty Acids (SM FISH OIL) 1000 MG CAPS Take 1 capsule by  mouth 2 (two) times daily.     STIOLTO RESPIMAT 2.5-2.5 MCG/ACT AERS SMARTSIG:2 Puff(s) Via Inhaler Daily     traZODone (DESYREL) 50 MG tablet Take 1 tablet by mouth 2 (two) times daily.     Vitamin D, Ergocalciferol, (DRISDOL) 1.25 MG (50000 UNIT) CAPS capsule Take 50,000 Units by mouth every 7 (seven) days.     No current facility-administered medications for this visit.      REVIEW OF SYSTEMS (Negative unless checked)  Constitutional: [] Weight loss  [] Fever  [] Chills Cardiac: [] Chest pain   [] Chest pressure   [] Palpitations   [] Shortness of breath when laying flat   [] Shortness of breath at rest   [] Shortness of breath with exertion. Vascular:  [] Pain in legs with walking   [] Pain in legs at rest   [] Pain in legs when laying flat   [] Claudication   [] Pain in feet when walking  [] Pain in feet at rest  [] Pain in feet when laying flat   [] History of DVT   [] Phlebitis   [x] Swelling in legs   [x] Varicose veins   [] Non-healing ulcers Pulmonary:   [] Uses home oxygen   [] Productive cough   [] Hemoptysis   [] Wheeze  [] COPD   [] Asthma Neurologic:  [] Dizziness  [] Blackouts   [] Seizures   [] History of stroke   [] History of TIA  [] Aphasia   [] Temporary blindness   [] Dysphagia   [] Weakness or numbness in arms   [] Weakness or numbness in legs Musculoskeletal:  [x] Arthritis   [] Joint swelling   [x] Joint pain   [] Low back pain Hematologic:  [] Easy bruising  [] Easy bleeding   [] Hypercoagulable state   [] Anemic  [] Hepatitis Gastrointestinal:  [] Blood in stool   [] Vomiting blood  [] Gastroesophageal reflux/heartburn   [] Abdominal pain Genitourinary:  [x] Chronic kidney disease   [] Difficult urination  [] Frequent urination  [] Burning with urination   [] Hematuria Skin:  [] Rashes   [] Ulcers   [] Wounds Psychological:  [x] History of anxiety   []  History of major depression.    Physical Exam Resp 19   Ht 5\' 3"  (1.6 m)   Wt 156 lb 12.8 oz (71.1 kg)   BMI 27.78 kg/m  Gen:  WD/WN, NAD Head: [place]/AT, No temporalis  wasting.  Ear/Nose/Throat: Hearing grossly intact, nares w/o erythema or drainage, oropharynx w/o Erythema/Exudate Eyes: Conjunctiva clear, sclera non-icteric  Neck: trachea midline.  No JVD.  Pulmonary:  Good air movement, respirations not labored, no use of accessory muscles  Cardiac: RRR, no JVD Vascular:  Vessel Right Left  Radial Palpable Palpable  Gastrointestinal:. No masses, surgical incisions, or scars. Musculoskeletal: M/S 5/5 throughout.  Extremities without ischemic changes.  No deformity or atrophy. 1+ RLE edema.  Diffuse superficial varicosities present bilaterally Neurologic: Sensation grossly intact in extremities.  Symmetrical.  Speech is fluent. Motor exam as listed above. Psychiatric: Judgment and insight appear fair. Dermatologic: No rashes or ulcers noted.  No cellulitis or open wounds.  Vitiligo present    Radiology No results found.  Labs Recent Results (from the past 2160 hour(s))  Hemoglobin A1c     Status: Abnormal   Collection Time: 12/04/22 10:03 AM  Result Value Ref Range   Hgb A1c MFr Bld 6.3 (H) 4.8 - 5.6 %    Comment:          Prediabetes: 5.7 - 6.4          Diabetes: >6.4          Glycemic control for adults with diabetes: <7.0    Est. average glucose Bld gHb Est-mCnc 134 mg/dL  TSH     Status: None   Collection Time: 12/04/22 10:03 AM  Result Value Ref Range   TSH 1.660 0.450 - 4.500 uIU/mL  CMP14+EGFR     Status: Abnormal   Collection Time: 12/04/22 10:03 AM  Result Value Ref Range   Glucose 91 70 - 99 mg/dL   BUN 16 8 - 27 mg/dL   Creatinine, Ser 1.61 (H) 0.57 - 1.00 mg/dL   eGFR 52 (L) >09 UE/AVW/0.98   BUN/Creatinine Ratio 14 12 - 28   Sodium 141 134 - 144 mmol/L   Potassium 4.1 3.5 - 5.2 mmol/L   Chloride 102 96 - 106 mmol/L   CO2 23 20 - 29 mmol/L   Calcium 10.0 8.7 - 10.3 mg/dL   Total Protein 7.1 6.0 - 8.5 g/dL   Albumin 4.4 3.9 - 4.9 g/dL   Globulin, Total 2.7 1.5 - 4.5 g/dL    Albumin/Globulin Ratio 1.6 1.2 - 2.2   Bilirubin Total 0.6 0.0 - 1.2 mg/dL   Alkaline Phosphatase 63 44 - 121 IU/L   AST 15 0 - 40 IU/L   ALT 11 0 - 32 IU/L  Lipid panel     Status: None   Collection Time: 12/04/22 10:03 AM  Result Value Ref Range   Cholesterol, Total 113 100 - 199 mg/dL   Triglycerides 99 0 - 149 mg/dL   HDL 46 >11 mg/dL   VLDL Cholesterol Cal 19 5 - 40 mg/dL   LDL Chol Calc (NIH) 48 0 - 99 mg/dL   Chol/HDL Ratio 2.5 0.0 - 4.4 ratio    Comment:                                   T. Chol/HDL Ratio                                             Men  Women                               1/2 Avg.Risk  3.4    3.3  Avg.Risk  5.0    4.4                                2X Avg.Risk  9.6    7.1                                3X Avg.Risk 23.4   11.0   CBC With Differential     Status: Abnormal   Collection Time: 12/04/22 10:03 AM  Result Value Ref Range   WBC 8.1 3.4 - 10.8 x10E3/uL   RBC 4.36 3.77 - 5.28 x10E6/uL   Hemoglobin 11.3 11.1 - 15.9 g/dL   Hematocrit 40.9 81.1 - 46.6 %   MCV 81 79 - 97 fL   MCH 25.9 (L) 26.6 - 33.0 pg   MCHC 32.0 31.5 - 35.7 g/dL   RDW 91.4 78.2 - 95.6 %   Neutrophils 68 Not Estab. %   Lymphs 20 Not Estab. %   Monocytes 9 Not Estab. %   Eos 2 Not Estab. %   Basos 1 Not Estab. %   Neutrophils Absolute 5.5 1.4 - 7.0 x10E3/uL   Lymphocytes Absolute 1.7 0.7 - 3.1 x10E3/uL   Monocytes Absolute 0.8 0.1 - 0.9 x10E3/uL   EOS (ABSOLUTE) 0.2 0.0 - 0.4 x10E3/uL   Basophils Absolute 0.0 0.0 - 0.2 x10E3/uL   Immature Granulocytes 0 Not Estab. %   Immature Grans (Abs) 0.0 0.0 - 0.1 x10E3/uL    Assessment/Plan:  Varicose veins of bilateral lower extremities with pain  Recommend:  The patient has large symptomatic varicose veins that are painful and associated with swelling. The patient is CEAP [id number]   I have had a long discussion with the patient regarding  varicose veins and why they cause symptoms.  Patient  will begin wearing graduated compression stockings class 1 on a daily basis, beginning first thing in the morning and removing them in the evening. The patient is instructed specifically not to sleep in the stockings.    The patient  will also begin using over-the-counter analgesics such as Motrin 600 mg po TID to help control the symptoms.    In addition, behavioral modification including elevation during the day will be initiated.    Pending the results of these changes the  patient will be reevaluated in three months.   An ultrasound of the venous system will be obtained.   Further plans will be based on the ultrasound results and whether conservative therapies are successful at eliminating the pain and swelling.   Stage 3b chronic kidney disease (HCC) Could be contributing to lower extremity swelling.  Mixed hyperlipidemia lipid control important in reducing the progression of atherosclerotic disease. Continue statin therapy      Festus Barren 01/04/2023, 11:02 AM   This note was created with Dragon medical transcription system.  Any errors from dictation are unintentional.

## 2023-01-04 NOTE — Assessment & Plan Note (Signed)
Recommend:  The patient has large symptomatic varicose veins that are painful and associated with swelling. The patient is CEAP C4sEpAsPr   I have had a long discussion with the patient regarding  varicose veins and why they cause symptoms.  Patient will begin wearing graduated compression stockings class 1 on a daily basis, beginning first thing in the morning and removing them in the evening. The patient is instructed specifically not to sleep in the stockings.    The patient  will also begin using over-the-counter analgesics such as Motrin 600 mg po TID to help control the symptoms.    In addition, behavioral modification including elevation during the day will be initiated.    Pending the results of these changes the  patient will be reevaluated in three months.   An ultrasound of the venous system will be obtained.   Further plans will be based on the ultrasound results and whether conservative therapies are successful at eliminating the pain and swelling.  

## 2023-01-04 NOTE — Assessment & Plan Note (Signed)
lipid control important in reducing the progression of atherosclerotic disease. Continue statin therapy  

## 2023-01-08 ENCOUNTER — Other Ambulatory Visit: Payer: Self-pay | Admitting: Nurse Practitioner

## 2023-01-10 DIAGNOSIS — J449 Chronic obstructive pulmonary disease, unspecified: Secondary | ICD-10-CM | POA: Diagnosis not present

## 2023-01-13 ENCOUNTER — Other Ambulatory Visit: Payer: Self-pay

## 2023-01-13 ENCOUNTER — Emergency Department
Admission: EM | Admit: 2023-01-13 | Discharge: 2023-01-13 | Disposition: A | Discharge status: Home / Self Care | Payer: 59 | Attending: Emergency Medicine | Admitting: Emergency Medicine

## 2023-01-13 DIAGNOSIS — M2550 Pain in unspecified joint: Secondary | ICD-10-CM | POA: Diagnosis not present

## 2023-01-13 DIAGNOSIS — M545 Low back pain, unspecified: Secondary | ICD-10-CM | POA: Insufficient documentation

## 2023-01-13 DIAGNOSIS — I129 Hypertensive chronic kidney disease with stage 1 through stage 4 chronic kidney disease, or unspecified chronic kidney disease: Secondary | ICD-10-CM | POA: Diagnosis not present

## 2023-01-13 DIAGNOSIS — E039 Hypothyroidism, unspecified: Secondary | ICD-10-CM | POA: Diagnosis not present

## 2023-01-13 DIAGNOSIS — N1832 Chronic kidney disease, stage 3b: Secondary | ICD-10-CM | POA: Insufficient documentation

## 2023-01-13 DIAGNOSIS — M25561 Pain in right knee: Secondary | ICD-10-CM | POA: Diagnosis not present

## 2023-01-13 DIAGNOSIS — M25511 Pain in right shoulder: Secondary | ICD-10-CM | POA: Diagnosis not present

## 2023-01-13 MED ORDER — NAPROXEN 250 MG PO TABS: 250.0000 mg | ORAL_TABLET | Freq: Two times a day (BID) | ORAL | 0 refills | Status: AC

## 2023-01-13 MED ORDER — NAPROXEN 500 MG PO TABS
250.0000 mg | ORAL_TABLET | Freq: Once | ORAL | Status: AC
  Administered 2023-01-13: 250 mg via ORAL
  Filled 2023-01-13: qty 1

## 2023-01-13 NOTE — ED Triage Notes (Signed)
Patient presents with LEFT sided pain, back pain, RIGHT shoulder, RIGHT knee pain as well as "fluid" in her RIGHT foot; Patient does ambulate with a cane and was told she has arthritis that she takes Tylenol for but has not had any relief; Symptoms began 3 weeks ago

## 2023-01-13 NOTE — Discharge Instructions (Addendum)
Please start taking the naproxen 250 mg twice a day for pain.  Do not take ibuprofen or the Excedrin Migraine if you are going to be taking this.  Please elevate your ankle to help with swelling and pain.  I would like you to follow-up with a rheumatologist in addition to your primary care doctor.  Please call the number above to schedule an appointment.

## 2023-01-13 NOTE — ED Provider Notes (Signed)
[facility] Provider Note    Event Date/Time   First MD Initiated Contact with Patient 01/13/23 1128     (approximate)   History   Back Pain (Patient presents with LEFT sided pain, back pain, RIGHT shoulder, RIGHT knee pain as well as "fluid" in her RIGHT foot; Patient does ambulate with a cane and was told she has arthritis that she takes Tylenol for but has not had any relief; Symptoms began 3 weeks ago)   HPI  Kristin Greer is a 67 y.o. female with past medical history of COPD, hypertension, hypothyroidism, gout who presents with body pain.  Patient tells me that she has been diagnosed with bursitis and has had shoulder and back problems in the past but over the last 3 weeks she has had more significant joint pain.  Tells me that it is basically her entire body.  She has pain in her left shoulder down the entire left arm and the entire left leg.  When I asked her which joint or area of the arm or leg is aware she said the entire thing.  She also endorses pain of her entire back but it is worse in the low middle back denies numbness or tingling down her legs or bowel bladder continence.  She also endorses right-sided pain right shoulder pain and right ankle pain and has noticed some swelling in her right foot and ankle.  She denies any injury.  She has talked to her doctor about this and has been taking Tylenol which she was told to do.  She does feel worse in the morning.  Denies fevers chills weight loss or malaise.  She is ambulating with a cane which is her baseline.  Denies headache or visual change.  Patient has seen Dr. Wyn Quaker with vascular about varicose veins of her bilateral lower extremities about a week ago and there is plan for possible intervention.  He recommended compression stockings and NSAIDs.    Past Medical History:  Diagnosis Date   Anxiety    Arthritis    Bursitis    Constipation    Fibroids    Gout    Hemorrhoids    Hyperlipidemia     Hypertension    Hypothyroid    Migraine    Wears dentures    full upper and lower    Patient Active Problem List   Diagnosis Date Noted   Varicose veins of bilateral lower extremities with pain 01/04/2023   Osteoarthritis 12/10/2022   Diabetes mellitus without complication (HCC) 09/11/2022   Stage 3b chronic kidney disease (HCC) 09/11/2022   Hypothyroidism, adult 09/11/2022   Mixed hyperlipidemia 09/11/2022   Hot flashes 12/17/2017     Physical Exam  Triage Vital Signs: ED Triage Vitals  Enc Vitals Group     BP 01/13/23 1115 120/78     Pulse Rate 01/13/23 1115 76     Resp 01/13/23 1115 20     Temp 01/13/23 1115 98 F (36.7 C)     Temp Source 01/13/23 1115 Oral     SpO2 01/13/23 1115 96 %     Weight 01/13/23 1112 154 lb (69.9 kg)     Height 01/13/23 1112 5\' 3"  (1.6 m)     Head Circumference --      Peak Flow --      Pain Score 01/13/23 1112 10     Pain Loc --      Pain Edu? --      Excl.  in GC? --     Most recent vital signs: Vitals:   01/13/23 1115  BP: 120/78  Pulse: 76  Resp: 20  Temp: 98 F (36.7 C)  SpO2: 96%     General: Awake, no distress.  CV:  Good peripheral perfusion.  Resp:  Normal effort.  Abd:  No distention.  Neuro:             Awake, Alert, Oriented x 3  Other:  Patient is able to ambulate with a cane She has some mild swelling of the right ankle but it is not warm or red and she is able to range it, there is no pain with micro movements No focal swelling warmth or redness of the bilateral shoulders elbows wrists hands knees and left ankle Pain in both shoulders with abduction but is able to get to 90 degrees Able to range both hips bilaterally, able to range both knees bilaterally 5 out of 5 strength with plantarflexion dorsiflexion and grip bilateral lower and upper extremities respectively 2+ DP pulses There is no pitting edema in the lower extremities   ED Results / Procedures / Treatments  Labs (all labs ordered are listed, but  only abnormal results are displayed) Labs Reviewed - No data to display   EKG     RADIOLOGY    PROCEDURES:  Critical Care performed: No  Procedures   MEDICATIONS ORDERED IN ED: Medications  naproxen (NAPROSYN) tablet 250 mg (250 mg Oral Given 01/13/23 1157)     IMPRESSION / MDM / ASSESSMENT AND PLAN / ED COURSE  I reviewed the triage vital signs and the nursing notes.                              Patient's presentation is most consistent with acute, uncomplicated illness.  Differential diagnosis includes, but is not limited to, osteoarthritis, inflammatory arthritis including gout, RA, myositis, polymyalgia rheumatica, fibromyalgia, low suspicion for septic arthritis  The patient is a 67 year old female with history of diabetes who presents because of pain in multiple joints and extremities.  She tells me she has a history of bursitis in her left shoulder and chronic low back pain but over the last several weeks she has had more significant joint and extremity pain.  She tells me that her entire left arm and left leg hurt most of the time.  She cannot pinpoint exactly which joint on the left side is the worst.  She also has pain behind her right knee and some swelling of the right ankle but otherwise has not noticed any other joint swelling.  She also has back pain which is mostly in the lower back without radicular symptoms or signs of cauda equina syndrome including with no bowel or bladder incontinence numbness or weakness.  She has been taking Tylenol for this.  She also has seen vascular about varicose veins in her bilateral lower extremities which they thought were contributing to her lower extremity pain and there is plan for additional intervention.  On exam patient overall looks well she is able to ambulate with a cane.  She does have some swelling of the right ankle which is asymmetric but it is not warm or red and she is able to range it I have low suspicion for septic  joint.  The left ankle is not swollen and she has no pitting edema in the calves.  She is able to range both her knees  hips and shoulders.  I do not appreciate joint effusions of the knees and they do not appear to be inflamed.  She has good sensation and strength in her lower extremities and good pulses.  Differential is as above.  My suspicion for a an acute inflammatory condition being polyarthritis is low given the joints for the most part do not appear acutely inflamed.  The only joint that has some swelling is the right ankle and I have very low suspicion for septic joint at this ankle.  Consider diagnosis of PMR given prominent shoulder symptoms but her symptoms are quite asymmetric and do not involve the distal extremities which is less typical of PMR and she does not have the systemic symptoms of fever malaise weight loss etc.  So I feel that this is less likely.  Ultimately my suspicion for an acute life-threatening process contributing to her symptoms is low.  I do think that she should see rheumatology given the widespread nature of her symptoms.  I reviewed her latest labs and her GFR is 52.  I do think a short course of NSAIDs is warranted.  She tells me she is just taking Tylenol.  Recommended naproxen to 250 mg twice daily.  Will give rheumatology referral.     FINAL CLINICAL IMPRESSION(S) / ED DIAGNOSES   Final diagnoses:  Arthralgia, unspecified joint     Rx / DC Orders   ED Discharge Orders     None        Note:  This document was prepared using Dragon voice recognition software and may include unintentional dictation errors.   Georga Hacking, MD 01/13/23 [phone]

## 2023-01-14 ENCOUNTER — Other Ambulatory Visit: Payer: Self-pay | Admitting: Nurse Practitioner

## 2023-01-17 ENCOUNTER — Telehealth: Payer: Self-pay

## 2023-01-17 NOTE — Telephone Encounter (Signed)
Transition Care Management Unsuccessful Follow-up Telephone Call  Date of discharge and from where:  [place] 6/16  Attempts:  1st Attempt  Reason for unsuccessful TCM follow-up call:  Left voice message   Zacariah Belue [facility], [place] [phone] [address], [place], [place] [address] Phone: [phone] Email: [email or web address]       

## 2023-01-18 ENCOUNTER — Telehealth: Payer: Self-pay

## 2023-01-18 NOTE — Telephone Encounter (Signed)
Transition Care Management Unsuccessful Follow-up Telephone Call  Date of discharge and from where:  [place] 6/16  Attempts:  2nd Attempt  Reason for unsuccessful TCM follow-up call:  Unable to leave message   Orpha Dain [facility], [place] [phone] [address], [place], [place] [address] Phone: [phone] Email: [email or web address]       

## 2023-02-08 ENCOUNTER — Ambulatory Visit (INDEPENDENT_AMBULATORY_CARE_PROVIDER_SITE_OTHER): Payer: 59

## 2023-02-08 ENCOUNTER — Ambulatory Visit (INDEPENDENT_AMBULATORY_CARE_PROVIDER_SITE_OTHER): Payer: 59 | Admitting: Vascular Surgery

## 2023-02-08 ENCOUNTER — Encounter (INDEPENDENT_AMBULATORY_CARE_PROVIDER_SITE_OTHER): Payer: Self-pay | Admitting: Vascular Surgery

## 2023-02-08 VITALS — BP 131/72 | HR 63 | Resp 18 | Ht 63.0 in | Wt 159.9 lb

## 2023-02-08 DIAGNOSIS — E119 Type 2 diabetes mellitus without complications: Secondary | ICD-10-CM

## 2023-02-08 DIAGNOSIS — M79609 Pain in unspecified limb: Secondary | ICD-10-CM | POA: Insufficient documentation

## 2023-02-08 DIAGNOSIS — M79605 Pain in left leg: Secondary | ICD-10-CM | POA: Diagnosis not present

## 2023-02-08 DIAGNOSIS — E782 Mixed hyperlipidemia: Secondary | ICD-10-CM

## 2023-02-08 DIAGNOSIS — M79604 Pain in right leg: Secondary | ICD-10-CM

## 2023-02-08 DIAGNOSIS — I83813 Varicose veins of bilateral lower extremities with pain: Secondary | ICD-10-CM

## 2023-02-08 NOTE — Assessment & Plan Note (Signed)
lipid control important in reducing the progression of atherosclerotic disease. Continue statin therapy  

## 2023-02-08 NOTE — Assessment & Plan Note (Addendum)
A venous reflux study was performed today.  This was a normal study with no evidence of DVT, superficial thrombophlebitis, or significant venous reflux present.  No role for laser or radiofrequency ablation would be warranted.  Sclerotherapy could be performed for her superficial varicosities as needed.

## 2023-02-08 NOTE — Assessment & Plan Note (Signed)
Although superficial varicosities can explain some itching and pain, I think a lot of what she describes as neurogenic pain likely from back or hip issues.  I will defer further workup to her primary care physician consideration for referral to orthopedics or neurosurgery would be appropriate.

## 2023-02-08 NOTE — Assessment & Plan Note (Signed)
blood glucose control important in reducing the progression of atherosclerotic disease. Also, involved in wound healing. On appropriate medications.  

## 2023-02-08 NOTE — Progress Notes (Signed)
MRN : [id number]  Kristin Greer is a 67 y.o. ([birth year]) female who presents with chief complaint of  Chief Complaint  Patient presents with   Follow-up    pt conv BLE reflux  .  History of Present Illness: Patient returns today in follow up of her lower extremity pain.  She has pain overlying her superficial varicose veins but also pain that radiates down her left hip and buttock area on the outside of her leg down to the foot.  She occasionally gets this on the right.  She has been told she has some arthritis in her hip and back.  No previous DVT or superficial thrombophlebitis.  A venous reflux study was performed today.  This was a normal study with no evidence of DVT, superficial thrombophlebitis, or significant venous reflux present  Current Outpatient Medications  Medication Sig Dispense Refill   acetaminophen (TYLENOL) 650 MG CR tablet Take 1,300 mg by mouth every 8 (eight) hours as needed for pain.     albuterol (VENTOLIN HFA) 108 (90 Base) MCG/ACT inhaler INHALE 1 PUFF BY MOUTH EVERY 6 HOURS AS NEEDED FOR SHORTNESS OF BREATH AND OR WHEEZING 8.5 g 2   amLODipine (NORVASC) 5 MG tablet Take 1 tablet (5 mg total) by mouth daily. 30 tablet 11   aspirin EC 81 MG tablet Take 1 tablet by mouth daily.     aspirin-acetaminophen-caffeine (EXCEDRIN MIGRAINE) 250-250-65 MG tablet Take 2 tablets by mouth every 6 (six) hours as needed for headache.     atorvastatin (LIPITOR) 40 MG tablet Take 40 mg by mouth daily.     benztropine (COGENTIN) 1 MG tablet Take 1 mg by mouth 2 (two) times daily.     Calcium Carbonate-Vit D-Min (CALTRATE 600+D PLUS MINERALS) 600-800 MG-UNIT CHEW Caltrate 600+D Plus Minerals 600-800 MG-UNIT Oral Tablet Chewable QTY: 0 tablet Days: 0 Refills: 0  Written: 06/04/18 Patient Instructions: qd     cetirizine (ZYRTEC) 10 MG tablet Take 1 tablet by mouth daily.     cloNIDine (CATAPRES) 0.1 MG tablet Take 0.1 mg by mouth daily.     fluPHENAZine (PROLIXIN) 10 MG tablet Take 10  mg by mouth 3 (three) times daily.     fluticasone (FLONASE) 50 MCG/ACT nasal spray INSTILL 1 SPRAY IN EACH NOSTRIL TWICE DAILY 16 g 3   gabapentin (NEURONTIN) 100 MG capsule Take 1 capsule by mouth daily.     glipiZIDE (GLUCOTROL) 10 MG tablet Take 10 mg by mouth daily before breakfast.     HYDROXYZINE PAMOATE PO Take 25 mg by mouth 2 (two) times daily.     levothyroxine (SYNTHROID) 50 MCG tablet TAKE 1 TABLET BY  MOUTH EVERY MORNING ONAN EMPTY STOMACH 1 HOUR PRIOR TO BREAKFAST 30 tablet 5   losartan (COZAAR) 100 MG tablet Take 1 tablet (100 mg total) by mouth daily. 30 tablet 11   meloxicam (MOBIC) 7.5 MG tablet Take 7.5 mg by mouth daily.     montelukast (SINGULAIR) 10 MG tablet Take 1 tablet by mouth daily.     nicotine polacrilex (NICORETTE) 4 MG gum CHEW 1 GUM PIECE UP TO 6 PER 24 HOURS FOR NICOTINE CRAVING AS NEEDED 160 each 1   NICOTINE POLACRILEX MT Use as directed 4 mg in the mouth or throat daily.     Omega-3 Fatty Acids (SM FISH OIL) 1000 MG CAPS Take 1 capsule by mouth 2 (two) times daily.     senna (SENOKOT) 8.6 MG tablet Take 1 tablet by mouth daily.  STIOLTO RESPIMAT 2.5-2.5 MCG/ACT AERS SMARTSIG:2 Puff(s) Via Inhaler Daily     traZODone (DESYREL) 50 MG tablet Take 1 tablet by mouth 2 (two) times daily.     Vitamin D, Ergocalciferol, (DRISDOL) 1.25 MG (50000 UNIT) CAPS capsule Take 50,000 Units by mouth every 7 (seven) days.     No current facility-administered medications for this visit.    Past Medical History:  Diagnosis Date   Anxiety    Arthritis    Bursitis    Constipation    Fibroids    Gout    Hemorrhoids    Hyperlipidemia    Hypertension    Hypothyroid    Migraine    Wears dentures    full upper and lower    Past Surgical History:  Procedure Laterality Date   CATARACT EXTRACTION W/PHACO Left 07/07/2020   Procedure: CATARACT EXTRACTION PHACO AND INTRAOCULAR LENS PLACEMENT (IOC) LEFT DIABETIC 4.12 00:34.6;  Surgeon: Galen Manila, MD;  Location:  [facility];  Service: Ophthalmology;  Laterality: Left;  Diabetic - oral meds   CATARACT EXTRACTION W/PHACO Right 08/02/2020   Procedure: CATARACT EXTRACTION PHACO AND INTRAOCULAR LENS PLACEMENT (IOC) RIGHT DIABETIC 4.25 00:34.5;  Surgeon: Galen Manila, MD;  Location: [facility];  Service: Ophthalmology;  Laterality: Right;   DG TEETH FULL     all teeth pulled has partials   LAPAROSCOPIC SUPRACERVICAL HYSTERECTOMY  2012   turmors removed     one in [employer] yrs ago; 5-6 removed 2015 [facility]     Social History   Tobacco Use   Smoking status: Former    Current packs/day: 0.00    Types: Cigarettes    Quit date: 09/28/2019    Years since quitting: 3.3   Smokeless tobacco: Never  Vaping Use   Vaping status: Former  Substance Use Topics   Alcohol use: Yes    Comment: rarely   Drug use: Never       Family History  Problem Relation Age of Onset   Breast cancer Sister 32   Lupus Sister      Allergies  Allergen Reactions   Varenicline     Note: patient sleepwalks     REVIEW OF SYSTEMS (Negative unless checked)  Constitutional: [] Weight loss  [] Fever  [] Chills Cardiac: [] Chest pain   [] Chest pressure   [] Palpitations   [] Shortness of breath when laying flat   [] Shortness of breath at rest   [] Shortness of breath with exertion. Vascular:  [] Pain in legs with walking   [] Pain in legs at rest   [] Pain in legs when laying flat   [] Claudication   [] Pain in feet when walking  [] Pain in feet at rest  [] Pain in feet when laying flat   [] History of DVT   [] Phlebitis   [x] Swelling in legs   [x] Varicose veins   [] Non-healing ulcers Pulmonary:   [] Uses home oxygen   [] Productive cough   [] Hemoptysis   [] Wheeze  [] COPD   [] Asthma Neurologic:  [] Dizziness  [] Blackouts   [] Seizures   [] History of stroke   [] History of TIA  [] Aphasia   [] Temporary blindness   [] Dysphagia   [] Weakness or numbness in arms   [] Weakness or numbness in legs Musculoskeletal:  [] Arthritis    [] Joint swelling   [x] Joint pain   [x] Low back pain Hematologic:  [] Easy bruising  [] Easy bleeding   [] Hypercoagulable state   [] Anemic   Gastrointestinal:  [] Blood in stool   [] Vomiting blood  [] Gastroesophageal reflux/heartburn   [] Abdominal pain Genitourinary:  []   Chronic kidney disease   [] Difficult urination  [] Frequent urination  [] Burning with urination   [] Hematuria Skin:  [] Rashes   [] Ulcers   [] Wounds Psychological:  [] History of anxiety   []  History of major depression.  Physical Examination  BP 131/72 (BP Location: Right Arm)   Pulse 63   Resp 18   Ht 5\' 3"  (1.6 m)   Wt 159 lb 14.4 oz (72.5 kg)   BMI 28.33 kg/m  Gen:  WD/WN, NAD Head: [place]/AT, No temporalis wasting. Ear/Nose/Throat: Hearing grossly intact, nares w/o erythema or drainage Eyes: Conjunctiva clear. Sclera non-icteric Neck: Supple.  Trachea midline Pulmonary:  Good air movement, no use of accessory muscles.  Cardiac: RRR, no JVD Vascular:  Vessel Right Left  Radial Palpable Palpable               Musculoskeletal: M/S 5/5 throughout.  No deformity or atrophy.  Diffuse superficial varicosities present bilaterally.  Trace right lower leg and foot edema. Neurologic: Sensation grossly intact in extremities.  Symmetrical.  Speech is fluent.  Psychiatric: Judgment intact, Mood & affect appropriate for pt's clinical situation. Dermatologic: No rashes or ulcers noted.  No cellulitis or open wounds.      Labs Recent Results (from the past 2160 hour(s))  Hemoglobin A1c     Status: Abnormal   Collection Time: 12/04/22 10:03 AM  Result Value Ref Range   Hgb A1c MFr Bld 6.3 (H) 4.8 - 5.6 %    Comment:          Prediabetes: 5.7 - 6.4          Diabetes: >6.4          Glycemic control for adults with diabetes: <7.0    Est. average glucose Bld gHb Est-mCnc 134 mg/dL  TSH     Status: None   Collection Time: 12/04/22 10:03 AM  Result Value Ref Range   TSH 1.660 0.450 - 4.500 uIU/mL  CMP14+EGFR     Status:  Abnormal   Collection Time: 12/04/22 10:03 AM  Result Value Ref Range   Glucose 91 70 - 99 mg/dL   BUN 16 8 - 27 mg/dL   Creatinine, Ser 1.61 (H) 0.57 - 1.00 mg/dL   eGFR 52 (L) >09 UE/AVW/0.98   BUN/Creatinine Ratio 14 12 - 28   Sodium 141 134 - 144 mmol/L   Potassium 4.1 3.5 - 5.2 mmol/L   Chloride 102 96 - 106 mmol/L   CO2 23 20 - 29 mmol/L   Calcium 10.0 8.7 - 10.3 mg/dL   Total Protein 7.1 6.0 - 8.5 g/dL   Albumin 4.4 3.9 - 4.9 g/dL   Globulin, Total 2.7 1.5 - 4.5 g/dL   Albumin/Globulin Ratio 1.6 1.2 - 2.2   Bilirubin Total 0.6 0.0 - 1.2 mg/dL   Alkaline Phosphatase 63 44 - 121 IU/L   AST 15 0 - 40 IU/L   ALT 11 0 - 32 IU/L  Lipid panel     Status: None   Collection Time: 12/04/22 10:03 AM  Result Value Ref Range   Cholesterol, Total 113 100 - 199 mg/dL   Triglycerides 99 0 - 149 mg/dL   HDL 46 >11 mg/dL   VLDL Cholesterol Cal 19 5 - 40 mg/dL   LDL Chol Calc (NIH) 48 0 - 99 mg/dL   Chol/HDL Ratio 2.5 0.0 - 4.4 ratio    Comment:  T. Chol/HDL Ratio                                             Men  Women                               1/2 Avg.Risk  3.4    3.3                                   Avg.Risk  5.0    4.4                                2X Avg.Risk  9.6    7.1                                3X Avg.Risk 23.4   11.0   CBC With Differential     Status: Abnormal   Collection Time: 12/04/22 10:03 AM  Result Value Ref Range   WBC 8.1 3.4 - 10.8 x10E3/uL   RBC 4.36 3.77 - 5.28 x10E6/uL   Hemoglobin 11.3 11.1 - 15.9 g/dL   Hematocrit 16.1 09.6 - 46.6 %   MCV 81 79 - 97 fL   MCH 25.9 (L) 26.6 - 33.0 pg   MCHC 32.0 31.5 - 35.7 g/dL   RDW 04.5 40.9 - 81.1 %   Neutrophils 68 Not Estab. %   Lymphs 20 Not Estab. %   Monocytes 9 Not Estab. %   Eos 2 Not Estab. %   Basos 1 Not Estab. %   Neutrophils Absolute 5.5 1.4 - 7.0 x10E3/uL   Lymphocytes Absolute 1.7 0.7 - 3.1 x10E3/uL   Monocytes Absolute 0.8 0.1 - 0.9 x10E3/uL   EOS (ABSOLUTE)  0.2 0.0 - 0.4 x10E3/uL   Basophils Absolute 0.0 0.0 - 0.2 x10E3/uL   Immature Granulocytes 0 Not Estab. %   Immature Grans (Abs) 0.0 0.0 - 0.1 x10E3/uL    Radiology No results found.  Assessment/Plan  Varicose veins of bilateral lower extremities with pain A venous reflux study was performed today.  This was a normal study with no evidence of DVT, superficial thrombophlebitis, or significant venous reflux present.  No role for laser or radiofrequency ablation would be warranted.  Sclerotherapy could be performed for her superficial varicosities as needed.  Diabetes mellitus without complication (HCC) blood glucose control important in reducing the progression of atherosclerotic disease. Also, involved in wound healing. On appropriate medications.   Mixed hyperlipidemia lipid control important in reducing the progression of atherosclerotic disease. Continue statin therapy   Pain in limb Although superficial varicosities can explain some itching and pain, I think a lot of what she describes as neurogenic pain likely from back or hip issues.  I will defer further workup to her primary care physician consideration for referral to orthopedics or neurosurgery would be appropriate.    Festus Barren, MD  02/08/2023 3:39 PM    This note was created with Dragon medical transcription system.  Any errors from dictation are purely unintentional

## 2023-02-14 DIAGNOSIS — M25474 Effusion, right foot: Secondary | ICD-10-CM | POA: Diagnosis not present

## 2023-02-14 DIAGNOSIS — M7989 Other specified soft tissue disorders: Secondary | ICD-10-CM | POA: Diagnosis not present

## 2023-02-14 DIAGNOSIS — M47812 Spondylosis without myelopathy or radiculopathy, cervical region: Secondary | ICD-10-CM | POA: Diagnosis not present

## 2023-02-14 DIAGNOSIS — M3500 Sicca syndrome, unspecified: Secondary | ICD-10-CM | POA: Diagnosis not present

## 2023-02-14 DIAGNOSIS — M47816 Spondylosis without myelopathy or radiculopathy, lumbar region: Secondary | ICD-10-CM | POA: Diagnosis not present

## 2023-02-14 DIAGNOSIS — M79671 Pain in right foot: Secondary | ICD-10-CM | POA: Diagnosis not present

## 2023-02-14 DIAGNOSIS — M2550 Pain in unspecified joint: Secondary | ICD-10-CM | POA: Diagnosis not present

## 2023-02-16 ENCOUNTER — Other Ambulatory Visit: Payer: Self-pay | Admitting: Nurse Practitioner

## 2023-02-16 DIAGNOSIS — E119 Type 2 diabetes mellitus without complications: Secondary | ICD-10-CM

## 2023-03-07 ENCOUNTER — Other Ambulatory Visit: Payer: Self-pay | Admitting: Nurse Practitioner

## 2023-03-08 ENCOUNTER — Other Ambulatory Visit: Payer: Self-pay | Admitting: Nurse Practitioner

## 2023-03-31 ENCOUNTER — Other Ambulatory Visit: Payer: Self-pay | Admitting: Family

## 2023-04-11 ENCOUNTER — Other Ambulatory Visit: Payer: Self-pay | Admitting: Internal Medicine

## 2023-04-12 ENCOUNTER — Ambulatory Visit: Payer: 59 | Admitting: Nurse Practitioner

## 2023-04-15 ENCOUNTER — Other Ambulatory Visit: Payer: Self-pay | Admitting: Cardiology

## 2023-04-15 ENCOUNTER — Ambulatory Visit (INDEPENDENT_AMBULATORY_CARE_PROVIDER_SITE_OTHER): Payer: 59 | Admitting: Cardiology

## 2023-04-15 ENCOUNTER — Encounter: Payer: Self-pay | Admitting: Cardiology

## 2023-04-15 VITALS — BP 144/76 | HR 79 | Ht 63.0 in | Wt 165.0 lb

## 2023-04-15 DIAGNOSIS — N1832 Chronic kidney disease, stage 3b: Secondary | ICD-10-CM

## 2023-04-15 DIAGNOSIS — E119 Type 2 diabetes mellitus without complications: Secondary | ICD-10-CM | POA: Diagnosis not present

## 2023-04-15 DIAGNOSIS — E039 Hypothyroidism, unspecified: Secondary | ICD-10-CM

## 2023-04-15 DIAGNOSIS — E782 Mixed hyperlipidemia: Secondary | ICD-10-CM

## 2023-04-15 DIAGNOSIS — I10 Essential (primary) hypertension: Secondary | ICD-10-CM | POA: Diagnosis not present

## 2023-04-15 DIAGNOSIS — E1159 Type 2 diabetes mellitus with other circulatory complications: Secondary | ICD-10-CM | POA: Insufficient documentation

## 2023-04-15 MED ORDER — FLUTICASONE PROPIONATE 50 MCG/ACT NA SUSP: 2.0000 | Freq: Every day | NASAL | 3 refills | Status: DC

## 2023-04-15 NOTE — Progress Notes (Signed)
Established Patient Office Visit  Subjective:  Patient ID: Kristin Greer, female    DOB: 12-02-1955  Age: 67 y.o. MRN: [id number]  Chief Complaint  Patient presents with   Follow-up    4 month follow up    Patient in office for 4 month follow up. Patient doing well. Complains of having a cold today, taking OTC Tylenol to treat symptoms. Patient did not have blood work done, is fating today. Will have fasting blood work done today, will call with results.     No other concerns at this time.   Past Medical History:  Diagnosis Date   Anxiety    Arthritis    Bursitis    Constipation    Fibroids    Gout    Hemorrhoids    Hyperlipidemia    Hypertension    Hypothyroid    Migraine    Wears dentures    full upper and lower    Past Surgical History:  Procedure Laterality Date   CATARACT EXTRACTION W/PHACO Left 07/07/2020   Procedure: CATARACT EXTRACTION PHACO AND INTRAOCULAR LENS PLACEMENT (IOC) LEFT DIABETIC 4.12 00:34.6;  Surgeon: Galen Manila, MD;  Location: [facility];  Service: Ophthalmology;  Laterality: Left;  Diabetic - oral meds   CATARACT EXTRACTION W/PHACO Right 08/02/2020   Procedure: CATARACT EXTRACTION PHACO AND INTRAOCULAR LENS PLACEMENT (IOC) RIGHT DIABETIC 4.25 00:34.5;  Surgeon: Galen Manila, MD;  Location[facility];  Service: Ophthalmology;  Laterality: Right;   DG TEETH FULL     all teeth pulled has partials   LAPAROSCOPIC SUPRACERVICAL HYSTERECTOMY  2012   turmors removed     one in [employer] yrs ago; 5-6 removed 2015 [facility]    Social History   Socioeconomic History   Marital status: Divorced    Spouse name: Not on file   Number of children: Not on file   Years of education: Not on file   Highest education level: Not on file  Occupational History   Not on file  Tobacco Use   Smoking status: Former    Current packs/day: 0.00    Types: Cigarettes    Quit date: 09/28/2019    Years since quitting: 3.5   Smokeless  tobacco: Never  Vaping Use   Vaping status: Former  Substance and Sexual Activity   Alcohol use: Yes    Comment: rarely   Drug use: Never   Sexual activity: Not Currently    Birth control/protection: Post-menopausal  Other Topics Concern   Not on file  Social History Narrative   Not on file   Social Determinants of Health   Financial Resource Strain: Not on file  Food Insecurity: Not on file  Transportation Needs: Not on file  Physical Activity: Not on file  Stress: Not on file  Social Connections: Not on file  Intimate Partner Violence: Not on file    Family History  Problem Relation Age of Onset   Breast cancer Sister 22   Lupus Sister     Allergies  Allergen Reactions   Varenicline     Note: patient sleepwalks    Review of Systems  Constitutional: Negative.   HENT: Negative.         Nasal dishcarge  Eyes: Negative.   Respiratory: Negative.  Negative for shortness of breath.   Cardiovascular: Negative.  Negative for chest pain.  Gastrointestinal: Negative.  Negative for abdominal pain, constipation and diarrhea.  Genitourinary: Negative.   Musculoskeletal:  Negative for joint pain and myalgias.  Skin: Negative.   Neurological: Negative.  Negative for dizziness and headaches.  Endo/Heme/Allergies: Negative.   All other systems reviewed and are negative.      Objective:   BP (!) 144/76   Pulse 79   Ht 5\' 3"  (1.6 m)   Wt 165 lb (74.8 kg)   SpO2 95%   BMI 29.23 kg/m   Vitals:   04/15/23 1056  BP: (!) 144/76  Pulse: 79  Height: 5\' 3"  (1.6 m)  Weight: 165 lb (74.8 kg)  SpO2: 95%  BMI (Calculated): 29.24    Physical Exam Vitals and nursing note reviewed.  Constitutional:      Appearance: Normal appearance. She is normal weight.  HENT:     Head: Normocephalic and atraumatic.     Nose: Nose normal.     Mouth/Throat:     Mouth: Mucous membranes are moist.  Eyes:     Extraocular Movements: Extraocular movements intact.      Conjunctiva/sclera: Conjunctivae normal.     Pupils: Pupils are equal, round, and reactive to light.  Cardiovascular:     Rate and Rhythm: Normal rate and regular rhythm.     Pulses: Normal pulses.     Heart sounds: Normal heart sounds.  Pulmonary:     Effort: Pulmonary effort is normal.     Breath sounds: Normal breath sounds.  Abdominal:     General: Abdomen is flat. Bowel sounds are normal.     Palpations: Abdomen is soft.  Musculoskeletal:        General: Normal range of motion.     Cervical back: Normal range of motion.  Skin:    General: Skin is warm and dry.  Neurological:     General: No focal deficit present.     Mental Status: She is alert and oriented to person, place, and time.  Psychiatric:        Mood and Affect: Mood normal.        Behavior: Behavior normal.        Thought Content: Thought content normal.        Judgment: Judgment normal.      No results found for any visits on 04/15/23.  No results found for this or any previous visit (from the past 2160 hour(s)).    Assessment & Plan:  Continue all medications. Fasting labs today.   Problem List Items Addressed This Visit       Cardiovascular and Mediastinum   Primary hypertension   Relevant Orders   TSH   CBC with Differential/Platelet     Endocrine   Diabetes mellitus without complication (HCC) - Primary   Relevant Orders   CMP14+EGFR   Hemoglobin A1c   Hypothyroidism, adult   Relevant Orders   CBC with Differential/Platelet     Genitourinary   Stage 3b chronic kidney disease (HCC)   Relevant Orders   CBC with Differential/Platelet     Other   Mixed hyperlipidemia   Relevant Orders   Lipid Profile    Return in about 4 months (around 08/15/2023).   Total time spent: 25 minutes  Google, NP  04/15/2023   This document may have been prepared by Dragon Voice Recognition software and as such may include unintentional dictation errors.

## 2023-04-16 LAB — CBC WITH DIFFERENTIAL/PLATELET
Basophils Absolute: 0.1 10*3/uL (ref 0.0–0.2)
Basos: 1 %
EOS (ABSOLUTE): 0.2 10*3/uL (ref 0.0–0.4)
Eos: 3 %
Hematocrit: 37.9 % (ref 34.0–46.6)
Hemoglobin: 12 g/dL (ref 11.1–15.9)
Immature Grans (Abs): 0 10*3/uL (ref 0.0–0.1)
Immature Granulocytes: 0 %
Lymphocytes Absolute: 2 10*3/uL (ref 0.7–3.1)
Lymphs: 28 %
MCH: 26.2 pg — ABNORMAL LOW (ref 26.6–33.0)
MCHC: 31.7 g/dL (ref 31.5–35.7)
MCV: 83 fL (ref 79–97)
Monocytes Absolute: 0.7 10*3/uL (ref 0.1–0.9)
Monocytes: 9 %
Neutrophils Absolute: 4.2 10*3/uL (ref 1.4–7.0)
Neutrophils: 59 %
Platelets: 222 10*3/uL (ref 150–450)
RBC: 4.58 x10E6/uL (ref 3.77–5.28)
RDW: 14.4 % (ref 11.7–15.4)
WBC: 7.1 10*3/uL (ref 3.4–10.8)

## 2023-04-16 LAB — LIPID PANEL
Chol/HDL Ratio: 2.8 ratio (ref 0.0–4.4)
Cholesterol, Total: 121 mg/dL (ref 100–199)
HDL: 44 mg/dL (ref >39)
LDL Chol Calc (NIH): 59 mg/dL (ref 0–99)
Triglycerides: 98 mg/dL (ref 0–149)
VLDL Cholesterol Cal: 18 mg/dL (ref 5–40)

## 2023-04-16 LAB — CMP14+EGFR
ALT: 15 IU/L (ref 0–32)
AST: 21 IU/L (ref 0–40)
Albumin: 4.5 g/dL (ref 3.9–4.9)
Alkaline Phosphatase: 78 IU/L (ref 44–121)
BUN/Creatinine Ratio: 10 — ABNORMAL LOW (ref 12–28)
BUN: 12 mg/dL (ref 8–27)
Bilirubin Total: 0.4 mg/dL (ref 0.0–1.2)
CO2: 22 mmol/L (ref 20–29)
Calcium: 10.4 mg/dL — ABNORMAL HIGH (ref 8.7–10.3)
Chloride: 103 mmol/L (ref 96–106)
Creatinine, Ser: 1.23 mg/dL — ABNORMAL HIGH (ref 0.57–1.00)
Globulin, Total: 2.6 g/dL (ref 1.5–4.5)
Glucose: 81 mg/dL (ref 70–99)
Potassium: 3.8 mmol/L (ref 3.5–5.2)
Sodium: 139 mmol/L (ref 134–144)
Total Protein: 7.1 g/dL (ref 6.0–8.5)
eGFR: 48 mL/min/{1.73_m2} — ABNORMAL LOW (ref >59)

## 2023-04-16 LAB — HEMOGLOBIN A1C
Est. average glucose Bld gHb Est-mCnc: 140 mg/dL
Hgb A1c MFr Bld: 6.5 % — ABNORMAL HIGH (ref 4.8–5.6)

## 2023-04-16 LAB — TSH: TSH: 1.34 u[IU]/mL (ref 0.450–4.500)

## 2023-04-16 NOTE — Progress Notes (Signed)
Patient notified

## 2023-05-11 ENCOUNTER — Other Ambulatory Visit: Payer: Self-pay | Admitting: Internal Medicine

## 2023-05-13 ENCOUNTER — Other Ambulatory Visit: Payer: Self-pay | Admitting: Internal Medicine

## 2023-05-13 DIAGNOSIS — J449 Chronic obstructive pulmonary disease, unspecified: Secondary | ICD-10-CM | POA: Diagnosis not present

## 2023-05-20 ENCOUNTER — Other Ambulatory Visit: Payer: Self-pay | Admitting: Family

## 2023-06-13 ENCOUNTER — Other Ambulatory Visit: Payer: Self-pay | Admitting: Family

## 2023-06-13 DIAGNOSIS — Z1231 Encounter for screening mammogram for malignant neoplasm of breast: Secondary | ICD-10-CM

## 2023-06-17 ENCOUNTER — Other Ambulatory Visit: Payer: Self-pay | Admitting: Cardiology

## 2023-06-18 DIAGNOSIS — H26491 Other secondary cataract, right eye: Secondary | ICD-10-CM | POA: Diagnosis not present

## 2023-06-18 DIAGNOSIS — H52223 Regular astigmatism, bilateral: Secondary | ICD-10-CM | POA: Diagnosis not present

## 2023-06-18 DIAGNOSIS — E119 Type 2 diabetes mellitus without complications: Secondary | ICD-10-CM | POA: Diagnosis not present

## 2023-06-18 DIAGNOSIS — H26492 Other secondary cataract, left eye: Secondary | ICD-10-CM | POA: Diagnosis not present

## 2023-06-18 DIAGNOSIS — H47239 Glaucomatous optic atrophy, unspecified eye: Secondary | ICD-10-CM | POA: Diagnosis not present

## 2023-06-19 DIAGNOSIS — M47812 Spondylosis without myelopathy or radiculopathy, cervical region: Secondary | ICD-10-CM | POA: Diagnosis not present

## 2023-06-19 DIAGNOSIS — M47816 Spondylosis without myelopathy or radiculopathy, lumbar region: Secondary | ICD-10-CM | POA: Diagnosis not present

## 2023-06-19 DIAGNOSIS — R768 Other specified abnormal immunological findings in serum: Secondary | ICD-10-CM | POA: Diagnosis not present

## 2023-06-28 ENCOUNTER — Other Ambulatory Visit: Payer: Self-pay | Admitting: Internal Medicine

## 2023-07-04 ENCOUNTER — Other Ambulatory Visit: Payer: Self-pay | Admitting: Cardiology

## 2023-07-04 ENCOUNTER — Telehealth: Payer: Self-pay | Admitting: Cardiology

## 2023-07-04 MED ORDER — AMOXICILLIN-POT CLAVULANATE 875-125 MG PO TABS
1.0000 | ORAL_TABLET | Freq: Two times a day (BID) | ORAL | 0 refills | Status: AC
Start: 2023-07-04 — End: 2023-07-11

## 2023-07-04 NOTE — Telephone Encounter (Signed)
Patient called in congested and blowing out yellow/green mucus. Has an AWV on Monday but wants to know if we can send her in something today. Please advise.  [facility]

## 2023-07-08 ENCOUNTER — Ambulatory Visit: Payer: 59 | Admitting: Cardiology

## 2023-07-08 ENCOUNTER — Other Ambulatory Visit: Payer: Self-pay | Admitting: Internal Medicine

## 2023-07-10 ENCOUNTER — Ambulatory Visit
Admission: RE | Admit: 2023-07-10 | Discharge: 2023-07-10 | Disposition: A | Discharge status: Home / Self Care | Payer: 59 | Source: Ambulatory Visit | Attending: Family | Admitting: Family

## 2023-07-10 DIAGNOSIS — Z1231 Encounter for screening mammogram for malignant neoplasm of breast: Secondary | ICD-10-CM | POA: Insufficient documentation

## 2023-07-12 ENCOUNTER — Ambulatory Visit: Payer: 59 | Admitting: Cardiology

## 2023-07-19 ENCOUNTER — Encounter: Payer: Self-pay | Admitting: Cardiology

## 2023-07-19 ENCOUNTER — Ambulatory Visit (INDEPENDENT_AMBULATORY_CARE_PROVIDER_SITE_OTHER): Payer: 59 | Admitting: Cardiology

## 2023-07-19 VITALS — BP 122/78 | HR 78 | Ht 65.0 in | Wt 161.6 lb

## 2023-07-19 DIAGNOSIS — I10 Essential (primary) hypertension: Secondary | ICD-10-CM

## 2023-07-19 DIAGNOSIS — E119 Type 2 diabetes mellitus without complications: Secondary | ICD-10-CM

## 2023-07-19 DIAGNOSIS — E039 Hypothyroidism, unspecified: Secondary | ICD-10-CM | POA: Diagnosis not present

## 2023-07-19 DIAGNOSIS — Z0000 Encounter for general adult medical examination without abnormal findings: Secondary | ICD-10-CM

## 2023-07-19 DIAGNOSIS — E782 Mixed hyperlipidemia: Secondary | ICD-10-CM

## 2023-07-19 MED ORDER — NAPROXEN 500 MG PO TABS: 500.0000 mg | ORAL_TABLET | Freq: Two times a day (BID) | ORAL | 0 refills | Status: DC

## 2023-07-19 NOTE — Progress Notes (Signed)
Established Patient Office Visit  Subjective:  Patient ID: Kristin Greer, female    DOB: [birth year]  Age: 67 y.o. MRN: [id number]  Chief Complaint  Patient presents with   Annual Exam    AWV    Patient in office for annual medicare wellness exam. Patient doing well, no complaints today.    No other concerns at this time.   Past Medical History:  Diagnosis Date   Anxiety    Arthritis    Bursitis    Constipation    Fibroids    Gout    Hemorrhoids    Hyperlipidemia    Hypertension    Hypothyroid    Migraine    Wears dentures    full upper and lower    Past Surgical History:  Procedure Laterality Date   CATARACT EXTRACTION W/PHACO Left 07/07/2020   Procedure: CATARACT EXTRACTION PHACO AND INTRAOCULAR LENS PLACEMENT (IOC) LEFT DIABETIC 4.12 00:34.6;  Surgeon: Galen Manila, MD;  Location: [facility];  Service: Ophthalmology;  Laterality: Left;  Diabetic - oral meds   CATARACT EXTRACTION W/PHACO Right 08/02/2020   Procedure: CATARACT EXTRACTION PHACO AND INTRAOCULAR LENS PLACEMENT (IOC) RIGHT DIABETIC 4.25 00:34.5;  Surgeon: Galen Manila, MD;  Location: [facility];  Service: Ophthalmology;  Laterality: Right;   DG TEETH FULL     all teeth pulled has partials   LAPAROSCOPIC SUPRACERVICAL HYSTERECTOMY  2012   turmors removed     one in [employer] yrs ago; 5-6 removed 2015 [facility]    Social History   Socioeconomic History   Marital status: Divorced    Spouse name: Not on file   Number of children: Not on file   Years of education: Not on file   Highest education level: Not on file  Occupational History   Not on file  Tobacco Use   Smoking status: Former    Current packs/day: 0.00    Types: Cigarettes    Quit date: 09/28/2019    Years since quitting: 3.8   Smokeless tobacco: Never  Vaping Use   Vaping status: Former  Substance and Sexual Activity   Alcohol use: Yes    Comment: rarely   Drug use: Never   Sexual activity: Not Currently     Birth control/protection: Post-menopausal  Other Topics Concern   Not on file  Social History Narrative   Not on file   Social Drivers of Health   Financial Resource Strain: Not on file  Food Insecurity: Not on file  Transportation Needs: Not on file  Physical Activity: Not on file  Stress: Not on file  Social Connections: Not on file  Intimate Partner Violence: Not on file    Family History  Problem Relation Age of Onset   Breast cancer Sister 17   Lupus Sister     Allergies  Allergen Reactions   Varenicline     Note: patient sleepwalks    Outpatient Medications Prior to Visit  Medication Sig   acetaminophen (TYLENOL) 650 MG CR tablet Take 1,300 mg by mouth every 8 (eight) hours as needed for pain.   albuterol (VENTOLIN HFA) 108 (90 Base) MCG/ACT inhaler INHALE 1 PUFF BY MOUTH EVERY 6 HOURS AS NEEDED FOR SHORTNESS OF BREATH AND OR WHEEZING   amLODipine (NORVASC) 5 MG tablet Take 1 tablet (5 mg total) by mouth daily.   aspirin EC (ASPIRIN LOW DOSE) 81 MG tablet TAKE 1 TABLET BY MOUTH DAILY   aspirin-acetaminophen-caffeine (EXCEDRIN MIGRAINE) 250-250-65 MG tablet Take 2 tablets  by mouth every 6 (six) hours as needed for headache.   atorvastatin (LIPITOR) 40 MG tablet TAKE 1 TABLET BY MOUTH NIGHTLY AT BEDTIME FOR CHOLESTEROL (NOTE INCREASED DOSAGE)   benztropine (COGENTIN) 1 MG tablet Take 1 mg by mouth 2 (two) times daily.   calcium carbonate (CALCIUM 600) 1500 (600 Ca) MG TABS tablet TAKE 1 TABLET BY MOUTH DAILY   Calcium Carbonate-Vit D-Min (CALTRATE 600+D PLUS MINERALS) 600-800 MG-UNIT CHEW Caltrate 600+D Plus Minerals 600-800 MG-UNIT Oral Tablet Chewable QTY: 0 tablet Days: 0 Refills: 0  Written: 06/04/18 Patient Instructions: qd   cetirizine (ZYRTEC) 10 MG tablet Take 1 tablet by mouth daily.   cloNIDine (CATAPRES) 0.1 MG tablet Take 0.1 mg by mouth daily.   fluPHENAZine (PROLIXIN) 10 MG tablet Take 10 mg by mouth 3 (three) times daily.   fluticasone (FLONASE) 50  MCG/ACT nasal spray Place 2 sprays into both nostrils daily.   gabapentin (NEURONTIN) 100 MG capsule TAKE 1 CAPSULE BY MOUTH IN THE MORNING AND 1 CAPSULE BY MOUTH AT BEDTIME FOR TINGLING SENSATION   glipiZIDE (GLUCOTROL XL) 10 MG 24 hr tablet TAKE 1 TABLET BY MOUTH DAILY WITH BREAKFAST   HYDROXYZINE PAMOATE PO Take 25 mg by mouth 2 (two) times daily.   levothyroxine (SYNTHROID) 50 MCG tablet TAKE 1 TABLET BY  MOUTH EVERY MORNING ONAN EMPTY STOMACH 1 HOUR PRIOR TO BREAKFAST   losartan (COZAAR) 100 MG tablet Take 1 tablet (100 mg total) by mouth daily.   montelukast (SINGULAIR) 10 MG tablet TAKE 1 TABLET BY MOUTH DAILY FOR (NASAL CONGESTION)   nicotine polacrilex (NICORETTE) 4 MG gum CHEW 1 GUM PIECE FOR NICOTINE CRAVING ASNEEDED (UP TO 6 PIECES PER DAY)   OLANZapine (ZYPREXA) 20 MG tablet Take 20 mg by mouth at bedtime.   Omega-3 Fatty Acids (SM FISH OIL) 1000 MG CAPS Take 1 capsule by mouth 2 (two) times daily.   senna (SENOKOT) 8.6 MG tablet Take 1 tablet by mouth daily.   STIOLTO RESPIMAT 2.5-2.5 MCG/ACT AERS SMARTSIG:2 Puff(s) Via Inhaler Daily   traZODone (DESYREL) 50 MG tablet Take 1 tablet by mouth 2 (two) times daily.   Vitamin D, Ergocalciferol, (DRISDOL) 1.25 MG (50000 UNIT) CAPS capsule Take 50,000 Units by mouth every 7 (seven) days.   [DISCONTINUED] glipiZIDE (GLUCOTROL) 10 MG tablet Take 10 mg by mouth daily before breakfast.   [DISCONTINUED] meloxicam (MOBIC) 7.5 MG tablet Take 7.5 mg by mouth daily.   No facility-administered medications prior to visit.    Review of Systems  Constitutional: Negative.   HENT: Negative.    Eyes: Negative.   Respiratory: Negative.  Negative for shortness of breath.   Cardiovascular: Negative.  Negative for chest pain.  Gastrointestinal: Negative.  Negative for abdominal pain, constipation and diarrhea.  Genitourinary: Negative.   Musculoskeletal:  Negative for joint pain and myalgias.  Skin: Negative.   Neurological: Negative.  Negative for  dizziness and headaches.  Endo/Heme/Allergies: Negative.   All other systems reviewed and are negative.      Objective:   BP 122/78   Pulse 78   Ht 5\' 5"  (1.651 m)   Wt 161 lb 9.6 oz (73.3 kg)   SpO2 94%   BMI 26.89 kg/m   Vitals:   07/19/23 1343  BP: 122/78  Pulse: 78  Height: 5\' 5"  (1.651 m)  Weight: 161 lb 9.6 oz (73.3 kg)  SpO2: 94%  BMI (Calculated): 26.89    Physical Exam Vitals and nursing note reviewed.  Constitutional:  Appearance: Normal appearance. She is normal weight.  HENT:     Head: Normocephalic and atraumatic.     Nose: Nose normal.     Mouth/Throat:     Mouth: Mucous membranes are moist.  Eyes:     Extraocular Movements: Extraocular movements intact.     Conjunctiva/sclera: Conjunctivae normal.     Pupils: Pupils are equal, round, and reactive to light.  Cardiovascular:     Rate and Rhythm: Normal rate and regular rhythm.     Pulses: Normal pulses.     Heart sounds: Normal heart sounds.  Pulmonary:     Effort: Pulmonary effort is normal.     Breath sounds: Normal breath sounds.  Abdominal:     General: Abdomen is flat. Bowel sounds are normal.     Palpations: Abdomen is soft.  Musculoskeletal:        General: Normal range of motion.     Cervical back: Normal range of motion.  Skin:    General: Skin is warm and dry.  Neurological:     General: No focal deficit present.     Mental Status: She is alert and oriented to person, place, and time.  Psychiatric:        Mood and Affect: Mood normal.        Behavior: Behavior normal.        Thought Content: Thought content normal.        Judgment: Judgment normal.      No results found for any visits on 07/19/23.  No results found for this or any previous visit (from the past 2160 hours).    Assessment & Plan:  Continue same medications.   Problem List Items Addressed This Visit       Cardiovascular and Mediastinum   Primary hypertension - Primary   Relevant Orders    CMP14+EGFR   TSH   CBC with Differential/Platelet     Endocrine   Diabetes mellitus without complication (HCC)   Relevant Orders   CMP14+EGFR   Hemoglobin A1c   CBC with Differential/Platelet   Hypothyroidism, adult   Relevant Orders   TSH   CBC with Differential/Platelet     Other   Mixed hyperlipidemia   Relevant Orders   Lipid Profile   CBC with Differential/Platelet    Return in about 4 weeks (around 08/16/2023) for keep January appt.   Total time spent: 25 minutes  Google, NP  07/19/2023   This document may have been prepared by Dragon Voice Recognition software and as such may include unintentional dictation errors.

## 2023-07-20 LAB — HEMOGLOBIN A1C
Est. average glucose Bld gHb Est-mCnc: 146 mg/dL
Hgb A1c MFr Bld: 6.7 % — ABNORMAL HIGH (ref 4.8–5.6)

## 2023-07-20 LAB — CBC WITH DIFFERENTIAL/PLATELET
Basophils Absolute: 0.1 10*3/uL (ref 0.0–0.2)
Basos: 1 %
EOS (ABSOLUTE): 0.4 10*3/uL (ref 0.0–0.4)
Eos: 5 %
Hematocrit: 38.2 % (ref 34.0–46.6)
Hemoglobin: 12.3 g/dL (ref 11.1–15.9)
Immature Grans (Abs): 0 10*3/uL (ref 0.0–0.1)
Immature Granulocytes: 0 %
Lymphocytes Absolute: 2.1 10*3/uL (ref 0.7–3.1)
Lymphs: 28 %
MCH: 25.7 pg — ABNORMAL LOW (ref 26.6–33.0)
MCHC: 32.2 g/dL (ref 31.5–35.7)
MCV: 80 fL (ref 79–97)
Monocytes Absolute: 0.6 10*3/uL (ref 0.1–0.9)
Monocytes: 8 %
Neutrophils Absolute: 4.3 10*3/uL (ref 1.4–7.0)
Neutrophils: 58 %
Platelets: 238 10*3/uL (ref 150–450)
RBC: 4.78 x10E6/uL (ref 3.77–5.28)
RDW: 15.1 % (ref 11.7–15.4)
WBC: 7.4 10*3/uL (ref 3.4–10.8)

## 2023-07-20 LAB — CMP14+EGFR
ALT: 14 [IU]/L (ref 0–32)
AST: 21 [IU]/L (ref 0–40)
Albumin: 4.4 g/dL (ref 3.9–4.9)
Alkaline Phosphatase: 84 [IU]/L (ref 44–121)
BUN/Creatinine Ratio: 16 (ref 12–28)
BUN: 20 mg/dL (ref 8–27)
Bilirubin Total: 0.5 mg/dL (ref 0.0–1.2)
CO2: 23 mmol/L (ref 20–29)
Calcium: 10.1 mg/dL (ref 8.7–10.3)
Chloride: 103 mmol/L (ref 96–106)
Creatinine, Ser: 1.28 mg/dL — ABNORMAL HIGH (ref 0.57–1.00)
Globulin, Total: 2.8 g/dL (ref 1.5–4.5)
Glucose: 92 mg/dL (ref 70–99)
Potassium: 3.9 mmol/L (ref 3.5–5.2)
Sodium: 140 mmol/L (ref 134–144)
Total Protein: 7.2 g/dL (ref 6.0–8.5)
eGFR: 46 mL/min/{1.73_m2} — ABNORMAL LOW (ref >59)

## 2023-07-20 LAB — LIPID PANEL
Chol/HDL Ratio: 3.2 {ratio} (ref 0.0–4.4)
Cholesterol, Total: 127 mg/dL (ref 100–199)
HDL: 40 mg/dL (ref >39)
LDL Chol Calc (NIH): 71 mg/dL (ref 0–99)
Triglycerides: 82 mg/dL (ref 0–149)
VLDL Cholesterol Cal: 16 mg/dL (ref 5–40)

## 2023-07-20 LAB — TSH: TSH: 0.731 u[IU]/mL (ref 0.450–4.500)

## 2023-07-25 ENCOUNTER — Other Ambulatory Visit: Payer: Self-pay | Admitting: Internal Medicine

## 2023-08-05 ENCOUNTER — Encounter: Payer: Self-pay | Admitting: Family

## 2023-08-07 ENCOUNTER — Telehealth: Payer: Self-pay | Admitting: Cardiology

## 2023-08-07 ENCOUNTER — Other Ambulatory Visit: Payer: Self-pay | Admitting: Family

## 2023-08-07 DIAGNOSIS — E119 Type 2 diabetes mellitus without complications: Secondary | ICD-10-CM

## 2023-08-07 NOTE — Telephone Encounter (Signed)
 Patient left VM c/o constipation. Requesting we send her something in to [facility]. Please advise.

## 2023-08-08 ENCOUNTER — Other Ambulatory Visit: Payer: Self-pay

## 2023-08-08 MED ORDER — CLONIDINE HCL 0.1 MG PO TABS: 0.1000 mg | ORAL_TABLET | Freq: Every day | ORAL | 11 refills | Status: DC

## 2023-08-15 ENCOUNTER — Ambulatory Visit: Payer: 59 | Admitting: Cardiology

## 2023-08-15 ENCOUNTER — Encounter: Payer: Self-pay | Admitting: Cardiology

## 2023-08-15 VITALS — BP 110/70 | HR 93 | Ht 65.0 in | Wt 160.2 lb

## 2023-08-15 DIAGNOSIS — I10 Essential (primary) hypertension: Secondary | ICD-10-CM | POA: Diagnosis not present

## 2023-08-15 DIAGNOSIS — Z23 Encounter for immunization: Secondary | ICD-10-CM | POA: Diagnosis not present

## 2023-08-15 DIAGNOSIS — E039 Hypothyroidism, unspecified: Secondary | ICD-10-CM | POA: Diagnosis not present

## 2023-08-15 DIAGNOSIS — E782 Mixed hyperlipidemia: Secondary | ICD-10-CM | POA: Diagnosis not present

## 2023-08-15 DIAGNOSIS — E119 Type 2 diabetes mellitus without complications: Secondary | ICD-10-CM

## 2023-08-15 LAB — GLUCOSE, POCT (MANUAL RESULT ENTRY): POC Glucose: 124 mg/dL — AB (ref 70–99)

## 2023-08-15 MED ORDER — MOUNJARO 2.5 MG/0.5ML ~~LOC~~ SOAJ: 2.5000 mg | SUBCUTANEOUS | 3 refills | Status: DC

## 2023-08-15 NOTE — Progress Notes (Signed)
Established Patient Office Visit  Subjective:  Patient ID: Kristin Greer, female    DOB: 1956/05/26  Age: 68 y.o. MRN: [id number]  Chief Complaint  Patient presents with   Follow-up    4 month    Patient in office for 4 month follow up, discuss recent lab results.  Patient requesting a flu shot today.  Discussed recent lab work, all stable. Patient requesting a weight loss injectable. Will send in [place]. Patient will call office to schedule an appointment for training on administering the medication.      No other concerns at this time.   Past Medical History:  Diagnosis Date   Anxiety    Arthritis    Bursitis    Constipation    Fibroids    Gout    Hemorrhoids    Hyperlipidemia    Hypertension    Hypothyroid    Migraine    Wears dentures    full upper and lower    Past Surgical History:  Procedure Laterality Date   CATARACT EXTRACTION W/PHACO Left 07/07/2020   Procedure: CATARACT EXTRACTION PHACO AND INTRAOCULAR LENS PLACEMENT (IOC) LEFT DIABETIC 4.12 00:34.6;  Surgeon: Galen Manila, MD;  Location: [facility];  Service: Ophthalmology;  Laterality: Left;  Diabetic - oral meds   CATARACT EXTRACTION W/PHACO Right 08/02/2020   Procedure: CATARACT EXTRACTION PHACO AND INTRAOCULAR LENS PLACEMENT (IOC) RIGHT DIABETIC 4.25 00:34.5;  Surgeon: Galen Manila, MD;  Location: [facility];  Service: Ophthalmology;  Laterality: Right;   DG TEETH FULL     all teeth pulled has partials   LAPAROSCOPIC SUPRACERVICAL HYSTERECTOMY  2012   turmors removed     one in [employer] yrs ago; 5-6 removed 2015 [facility]    Social History   Socioeconomic History   Marital status: Divorced    Spouse name: Not on file   Number of children: Not on file   Years of education: Not on file   Highest education level: Not on file  Occupational History   Not on file  Tobacco Use   Smoking status: Former    Current packs/day: 0.00    Types: Cigarettes    Quit date:  09/28/2019    Years since quitting: 3.8   Smokeless tobacco: Never  Vaping Use   Vaping status: Former  Substance and Sexual Activity   Alcohol use: Yes    Comment: rarely   Drug use: Never   Sexual activity: Not Currently    Birth control/protection: Post-menopausal  Other Topics Concern   Not on file  Social History Narrative   Not on file   Social Drivers of Health   Financial Resource Strain: Not on file  Food Insecurity: Not on file  Transportation Needs: Not on file  Physical Activity: Not on file  Stress: Not on file  Social Connections: Not on file  Intimate Partner Violence: Not on file    Family History  Problem Relation Age of Onset   Breast cancer Sister 3   Lupus Sister     Allergies  Allergen Reactions   Varenicline     Note: patient sleepwalks    Outpatient Medications Prior to Visit  Medication Sig   acetaminophen (TYLENOL) 650 MG CR tablet Take 1,300 mg by mouth every 8 (eight) hours as needed for pain.   albuterol (VENTOLIN HFA) 108 (90 Base) MCG/ACT inhaler INHALE 1 PUFF BY MOUTH EVERY 6 HOURS AS NEEDED FOR SHORTNESS OF BREATH AND OR WHEEZING   amLODipine (NORVASC) 5  MG tablet Take 1 tablet (5 mg total) by mouth daily.   aspirin EC (ASPIRIN LOW DOSE) 81 MG tablet TAKE 1 TABLET BY MOUTH DAILY   aspirin-acetaminophen-caffeine (EXCEDRIN MIGRAINE) 250-250-65 MG tablet Take 2 tablets by mouth every 6 (six) hours as needed for headache.   atorvastatin (LIPITOR) 40 MG tablet TAKE 1 TABLET BY MOUTH NIGHTLY AT BEDTIME FOR CHOLESTEROL (NOTE INCREASED DOSAGE)   benztropine (COGENTIN) 1 MG tablet Take 1 mg by mouth 2 (two) times daily.   calcium carbonate (CALCIUM 600) 1500 (600 Ca) MG TABS tablet TAKE 1 TABLET BY MOUTH DAILY   Calcium Carbonate-Vit D-Min (CALTRATE 600+D PLUS MINERALS) 600-800 MG-UNIT CHEW Caltrate 600+D Plus Minerals 600-800 MG-UNIT Oral Tablet Chewable QTY: 0 tablet Days: 0 Refills: 0  Written: 06/04/18 Patient Instructions: qd   cetirizine  (ZYRTEC) 10 MG tablet TAKE 1 TABLET BY MOUTH AT BEDTIME FOR ALLERGIES   cloNIDine (CATAPRES) 0.1 MG tablet Take 1 tablet (0.1 mg total) by mouth daily.   fluPHENAZine (PROLIXIN) 10 MG tablet Take 10 mg by mouth 3 (three) times daily.   fluticasone (FLONASE) 50 MCG/ACT nasal spray Place 2 sprays into both nostrils daily.   gabapentin (NEURONTIN) 100 MG capsule TAKE 1 CAPSULE BY MOUTH IN THE MORNING AND 1 CAPSULE BY MOUTH AT BEDTIME FOR TINGLING SENSATION   glipiZIDE (GLUCOTROL XL) 10 MG 24 hr tablet TAKE 1 TABLET BY MOUTH DAILY WITH BREAKFAST   HYDROXYZINE PAMOATE PO Take 25 mg by mouth 2 (two) times daily.   levothyroxine (SYNTHROID) 50 MCG tablet TAKE 1 TABLET BY  MOUTH EVERY MORNING ONAN EMPTY STOMACH 1 HOUR PRIOR TO BREAKFAST   losartan (COZAAR) 100 MG tablet Take 1 tablet (100 mg total) by mouth daily.   montelukast (SINGULAIR) 10 MG tablet TAKE 1 TABLET BY MOUTH DAILY FOR (NASAL CONGESTION)   naproxen (NAPROSYN) 500 MG tablet Take 1 tablet (500 mg total) by mouth 2 (two) times daily with a meal.   nicotine polacrilex (NICORETTE) 4 MG gum CHEW 1 GUM PIECE FOR NICOTINE CRAVING ASNEEDED (UP TO 6 PIECES PER DAY)   OLANZapine (ZYPREXA) 20 MG tablet Take 20 mg by mouth at bedtime.   Omega-3 Fatty Acids (SM FISH OIL) 1000 MG CAPS Take 1 capsule by mouth 2 (two) times daily.   senna (SENOKOT) 8.6 MG tablet Take 1 tablet by mouth daily.   STIOLTO RESPIMAT 2.5-2.5 MCG/ACT AERS SMARTSIG:2 Puff(s) Via Inhaler Daily   traZODone (DESYREL) 50 MG tablet Take 1 tablet by mouth 2 (two) times daily.   Vitamin D, Ergocalciferol, (DRISDOL) 1.25 MG (50000 UNIT) CAPS capsule TAKE 1 CAPSULE BY MOUTH WEEKLY FOR LOW VITAMIN D   No facility-administered medications prior to visit.    Review of Systems  Constitutional: Negative.   HENT: Negative.    Eyes: Negative.   Respiratory: Negative.  Negative for shortness of breath.   Cardiovascular: Negative.  Negative for chest pain.  Gastrointestinal: Negative.   Negative for abdominal pain, constipation and diarrhea.  Genitourinary: Negative.   Musculoskeletal:  Negative for joint pain and myalgias.  Skin: Negative.   Neurological: Negative.  Negative for dizziness and headaches.  Endo/Heme/Allergies: Negative.   All other systems reviewed and are negative.      Objective:   BP 110/70   Pulse 93   Ht 5\' 5"  (1.651 m)   Wt 160 lb 3.2 oz (72.7 kg)   SpO2 91%   BMI 26.66 kg/m   Vitals:   08/15/23 1012  BP: 110/70  Pulse: 93  Height: 5\' 5"  (1.651 m)  Weight: 160 lb 3.2 oz (72.7 kg)  SpO2: 91%  BMI (Calculated): 26.66    Physical Exam Vitals and nursing note reviewed.  Constitutional:      Appearance: Normal appearance. She is normal weight.  HENT:     Head: Normocephalic and atraumatic.     Nose: Nose normal.     Mouth/Throat:     Mouth: Mucous membranes are moist.  Eyes:     Extraocular Movements: Extraocular movements intact.     Conjunctiva/sclera: Conjunctivae normal.     Pupils: Pupils are equal, round, and reactive to light.  Cardiovascular:     Rate and Rhythm: Normal rate and regular rhythm.     Pulses: Normal pulses.     Heart sounds: Normal heart sounds.  Pulmonary:     Effort: Pulmonary effort is normal.     Breath sounds: Normal breath sounds.  Abdominal:     General: Abdomen is flat. Bowel sounds are normal.     Palpations: Abdomen is soft.  Musculoskeletal:        General: Normal range of motion.     Cervical back: Normal range of motion.  Skin:    General: Skin is warm and dry.  Neurological:     General: No focal deficit present.     Mental Status: She is alert and oriented to person, place, and time.  Psychiatric:        Mood and Affect: Mood normal.        Behavior: Behavior normal.        Thought Content: Thought content normal.        Judgment: Judgment normal.      Results for orders placed or performed in visit on 08/15/23  POCT Glucose (CBG)  Result Value Ref Range   POC Glucose 124  (A) 70 - 99 mg/dl    Recent Results (from the past 2160 hours)  CMP14+EGFR     Status: Abnormal   Collection Time: 07/19/23  2:16 PM  Result Value Ref Range   Glucose 92 70 - 99 mg/dL   BUN 20 8 - 27 mg/dL   Creatinine, Ser 5.36 (H) 0.57 - 1.00 mg/dL   eGFR 46 (L) >64 QI/HKV/4.25   BUN/Creatinine Ratio 16 12 - 28   Sodium 140 134 - 144 mmol/L   Potassium 3.9 3.5 - 5.2 mmol/L   Chloride 103 96 - 106 mmol/L   CO2 23 20 - 29 mmol/L   Calcium 10.1 8.7 - 10.3 mg/dL   Total Protein 7.2 6.0 - 8.5 g/dL   Albumin 4.4 3.9 - 4.9 g/dL   Globulin, Total 2.8 1.5 - 4.5 g/dL   Bilirubin Total 0.5 0.0 - 1.2 mg/dL   Alkaline Phosphatase 84 44 - 121 IU/L   AST 21 0 - 40 IU/L   ALT 14 0 - 32 IU/L  Hemoglobin A1c     Status: Abnormal   Collection Time: 07/19/23  2:16 PM  Result Value Ref Range   Hgb A1c MFr Bld 6.7 (H) 4.8 - 5.6 %    Comment:          Prediabetes: 5.7 - 6.4          Diabetes: >6.4          Glycemic control for adults with diabetes: <7.0    Est. average glucose Bld gHb Est-mCnc 146 mg/dL  Lipid Profile     Status: None   Collection Time: 07/19/23  2:16 PM  Result Value Ref Range   Cholesterol, Total 127 100 - 199 mg/dL   Triglycerides 82 0 - 149 mg/dL   HDL 40 >04 mg/dL   VLDL Cholesterol Cal 16 5 - 40 mg/dL   LDL Chol Calc (NIH) 71 0 - 99 mg/dL   Chol/HDL Ratio 3.2 0.0 - 4.4 ratio    Comment:                                   T. Chol/HDL Ratio                                             Men  Women                               1/2 Avg.Risk  3.4    3.3                                   Avg.Risk  5.0    4.4                                2X Avg.Risk  9.6    7.1                                3X Avg.Risk 23.4   11.0   TSH     Status: None   Collection Time: 07/19/23  2:16 PM  Result Value Ref Range   TSH 0.731 0.450 - 4.500 uIU/mL  CBC with Differential/Platelet     Status: Abnormal   Collection Time: 07/19/23  2:16 PM  Result Value Ref Range   WBC 7.4 3.4 - 10.8  x10E3/uL   RBC 4.78 3.77 - 5.28 x10E6/uL   Hemoglobin 12.3 11.1 - 15.9 g/dL   Hematocrit 54.0 98.1 - 46.6 %   MCV 80 79 - 97 fL   MCH 25.7 (L) 26.6 - 33.0 pg   MCHC 32.2 31.5 - 35.7 g/dL   RDW 19.1 47.8 - 29.5 %   Platelets 238 150 - 450 x10E3/uL   Neutrophils 58 Not Estab. %   Lymphs 28 Not Estab. %   Monocytes 8 Not Estab. %   Eos 5 Not Estab. %   Basos 1 Not Estab. %   Neutrophils Absolute 4.3 1.4 - 7.0 x10E3/uL   Lymphocytes Absolute 2.1 0.7 - 3.1 x10E3/uL   Monocytes Absolute 0.6 0.1 - 0.9 x10E3/uL   EOS (ABSOLUTE) 0.4 0.0 - 0.4 x10E3/uL   Basophils Absolute 0.1 0.0 - 0.2 x10E3/uL   Immature Granulocytes 0 Not Estab. %   Immature Grans (Abs) 0.0 0.0 - 0.1 x10E3/uL  POCT Glucose (CBG)     Status: Abnormal   Collection Time: 08/15/23 10:19 AM  Result Value Ref Range   POC Glucose 124 (A) 70 - 99 mg/dl      Assessment & Plan:  Flu shot administered today Mounjaro sent to the pharmacy, call office to schedule training with nursing staff Return in 4 weeks if she gets [facility], otherwise return in 4 months.   Problem List  Items Addressed This Visit       Cardiovascular and Mediastinum   Primary hypertension   Relevant Orders   Flu Vaccine Trivalent High Dose (Fluad) (Completed)     Endocrine   Diabetes mellitus without complication (HCC) - Primary   Relevant Medications   tirzepatide (MOUNJARO) 2.5 MG/0.5ML Pen   Other Relevant Orders   POCT Glucose (CBG) (Completed)   Flu Vaccine Trivalent High Dose (Fluad) (Completed)   Hypothyroidism, adult     Other   Mixed hyperlipidemia   Relevant Orders   Flu Vaccine Trivalent High Dose (Fluad) (Completed)    Return in about 4 weeks (around 09/12/2023) for if she gets the injectable medicine.   Total time spent: 25 minutes  Google, NP  08/15/2023   This document may have been prepared by Dragon Voice Recognition software and as such may include unintentional dictation errors.

## 2023-08-21 ENCOUNTER — Telehealth: Payer: Self-pay | Admitting: Cardiology

## 2023-08-21 ENCOUNTER — Other Ambulatory Visit: Payer: Self-pay | Admitting: Cardiology

## 2023-08-21 NOTE — Telephone Encounter (Signed)
Patient left VM that she was returning a call but I do not see where anyone has called her.

## 2023-08-26 ENCOUNTER — Other Ambulatory Visit: Payer: Self-pay | Admitting: Cardiology

## 2023-09-09 ENCOUNTER — Other Ambulatory Visit: Payer: Self-pay | Admitting: Cardiology

## 2023-09-09 DIAGNOSIS — J449 Chronic obstructive pulmonary disease, unspecified: Secondary | ICD-10-CM | POA: Diagnosis not present

## 2023-09-16 ENCOUNTER — Ambulatory Visit (INDEPENDENT_AMBULATORY_CARE_PROVIDER_SITE_OTHER): Payer: 59 | Admitting: Cardiology

## 2023-09-16 ENCOUNTER — Encounter: Payer: Self-pay | Admitting: Cardiology

## 2023-09-16 VITALS — BP 144/62 | HR 87 | Ht 65.0 in | Wt 154.0 lb

## 2023-09-16 DIAGNOSIS — E119 Type 2 diabetes mellitus without complications: Secondary | ICD-10-CM

## 2023-09-16 DIAGNOSIS — I10 Essential (primary) hypertension: Secondary | ICD-10-CM

## 2023-09-16 DIAGNOSIS — Z713 Dietary counseling and surveillance: Secondary | ICD-10-CM | POA: Diagnosis not present

## 2023-09-16 NOTE — Progress Notes (Signed)
 Established Patient Office Visit  Subjective:  Patient ID: Kristin Greer, female    DOB: 1955/11/27  Age: 68 y.o. MRN: [id number]  Chief Complaint  Patient presents with   Follow-up    4 weeks follow up    Patient in office for 4 week follow up. Patient has not started [facility], has it with her today for training. Patient doing well otherwise. Will have patient return in 4 weeks after starting medication.     No other concerns at this time.   Past Medical History:  Diagnosis Date   Anxiety    Arthritis    Bursitis    Constipation    Fibroids    Gout    Hemorrhoids    Hyperlipidemia    Hypertension    Hypothyroid    Migraine    Wears dentures    full upper and lower    Past Surgical History:  Procedure Laterality Date   CATARACT EXTRACTION W/PHACO Left 07/07/2020   Procedure: CATARACT EXTRACTION PHACO AND INTRAOCULAR LENS PLACEMENT (IOC) LEFT DIABETIC 4.12 00:34.6;  Surgeon: Galen Manila, MD;  Location: [facility];  Service: Ophthalmology;  Laterality: Left;  Diabetic - oral meds   CATARACT EXTRACTION W/PHACO Right 08/02/2020   Procedure: CATARACT EXTRACTION PHACO AND INTRAOCULAR LENS PLACEMENT (IOC) RIGHT DIABETIC 4.25 00:34.5;  Surgeon: Galen Manila, MD;  Location: [facility];  Service: Ophthalmology;  Laterality: Right;   DG TEETH FULL     all teeth pulled has partials   LAPAROSCOPIC SUPRACERVICAL HYSTERECTOMY  2012   turmors removed     one in [employer] yrs ago; 5-6 removed 2015 [facility]    Social History   Socioeconomic History   Marital status: Divorced    Spouse name: Not on file   Number of children: Not on file   Years of education: Not on file   Highest education level: Not on file  Occupational History   Not on file  Tobacco Use   Smoking status: Former    Current packs/day: 0.00    Types: Cigarettes    Quit date: 09/28/2019    Years since quitting: 3.9   Smokeless tobacco: Never  Vaping Use   Vaping status: Former   Substance and Sexual Activity   Alcohol use: Yes    Comment: rarely   Drug use: Never   Sexual activity: Not Currently    Birth control/protection: Post-menopausal  Other Topics Concern   Not on file  Social History Narrative   Not on file   Social Drivers of Health   Financial Resource Strain: Not on file  Food Insecurity: Not on file  Transportation Needs: Not on file  Physical Activity: Not on file  Stress: Not on file  Social Connections: Not on file  Intimate Partner Violence: Not on file    Family History  Problem Relation Age of Onset   Breast cancer Sister 62   Lupus Sister     Allergies  Allergen Reactions   Varenicline     Note: patient sleepwalks    Outpatient Medications Prior to Visit  Medication Sig   acetaminophen (TYLENOL) 650 MG CR tablet Take 1,300 mg by mouth every 8 (eight) hours as needed for pain.   albuterol (VENTOLIN HFA) 108 (90 Base) MCG/ACT inhaler INHALE 1 PUFF BY MOUTH EVERY 6 HOURS AS NEEDED FOR SHORTNESS OF BREATH AND OR WHEEZING   amLODipine (NORVASC) 5 MG tablet Take 1 tablet (5 mg total) by mouth daily.   aspirin EC (  ASPIRIN LOW DOSE) 81 MG tablet TAKE 1 TABLET BY MOUTH DAILY   aspirin-acetaminophen-caffeine (EXCEDRIN MIGRAINE) 250-250-65 MG tablet Take 2 tablets by mouth every 6 (six) hours as needed for headache.   atorvastatin (LIPITOR) 40 MG tablet TAKE 1 TABLET BY MOUTH NIGHTLY AT BEDTIME FOR CHOLESTEROL (NOTE INCREASED DOSAGE)   benztropine (COGENTIN) 1 MG tablet Take 1 mg by mouth 2 (two) times daily.   calcium carbonate (CALCIUM 600) 1500 (600 Ca) MG TABS tablet TAKE 1 TABLET BY MOUTH DAILY   Calcium Carbonate-Vit D-Min (CALTRATE 600+D PLUS MINERALS) 600-800 MG-UNIT CHEW Caltrate 600+D Plus Minerals 600-800 MG-UNIT Oral Tablet Chewable QTY: 0 tablet Days: 0 Refills: 0  Written: 06/04/18 Patient Instructions: qd   cetirizine (ZYRTEC) 10 MG tablet TAKE 1 TABLET BY MOUTH AT BEDTIME FOR ALLERGIES   cloNIDine (CATAPRES) 0.1 MG  tablet Take 1 tablet (0.1 mg total) by mouth daily.   fluPHENAZine (PROLIXIN) 10 MG tablet Take 10 mg by mouth 3 (three) times daily.   fluticasone (FLONASE) 50 MCG/ACT nasal spray Place 2 sprays into both nostrils daily.   gabapentin (NEURONTIN) 100 MG capsule TAKE 1 CAPSULE BY MOUTH IN THE MORNING AND 1 CAPSULE BY MOUTH AT BEDTIME FOR TINGLING SENSATION   glipiZIDE (GLUCOTROL XL) 10 MG 24 hr tablet TAKE 1 TABLET BY MOUTH DAILY WITH BREAKFAST   HYDROXYZINE PAMOATE PO Take 25 mg by mouth 2 (two) times daily.   levothyroxine (SYNTHROID) 50 MCG tablet TAKE 1 TABLET BY  MOUTH EVERY MORNING ONAN EMPTY STOMACH 1 HOUR PRIOR TO BREAKFAST   losartan (COZAAR) 100 MG tablet Take 1 tablet (100 mg total) by mouth daily.   montelukast (SINGULAIR) 10 MG tablet TAKE 1 TABLET BY MOUTH DAILY FOR (NASAL CONGESTION)   naproxen (NAPROSYN) 500 MG tablet TAKE 1 TABLET BY MOUTH 2 TIMES DAILY WITH A MEAL   nicotine polacrilex (NICORETTE) 4 MG gum CHEW 1 GUM PIECE FOR NICOTINE CRAVING ASNEEDED (UP TO 6 PIECES PER DAY)   OLANZapine (ZYPREXA) 20 MG tablet Take 20 mg by mouth at bedtime.   Omega-3 Fatty Acids (SM FISH OIL) 1000 MG CAPS Take 1 capsule by mouth 2 (two) times daily.   senna (SENOKOT) 8.6 MG tablet Take 1 tablet by mouth daily.   STIOLTO RESPIMAT 2.5-2.5 MCG/ACT AERS SMARTSIG:2 Puff(s) Via Inhaler Daily   tirzepatide (MOUNJARO) 2.5 MG/0.5ML Pen Inject 2.5 mg into the skin once a week.   traZODone (DESYREL) 50 MG tablet Take 1 tablet by mouth 2 (two) times daily.   Vitamin D, Ergocalciferol, (DRISDOL) 1.25 MG (50000 UNIT) CAPS capsule TAKE 1 CAPSULE BY MOUTH WEEKLY FOR LOW VITAMIN D   No facility-administered medications prior to visit.    Review of Systems  Constitutional: Negative.   HENT: Negative.    Eyes: Negative.   Respiratory: Negative.  Negative for shortness of breath.   Cardiovascular: Negative.  Negative for chest pain.  Gastrointestinal: Negative.  Negative for abdominal pain, constipation  and diarrhea.  Genitourinary: Negative.   Musculoskeletal:  Negative for joint pain and myalgias.  Skin: Negative.   Neurological: Negative.  Negative for dizziness and headaches.  Endo/Heme/Allergies: Negative.   All other systems reviewed and are negative.      Objective:   BP (!) 144/62   Pulse 87   Ht 5\' 5"  (1.651 m)   Wt 154 lb (69.9 kg)   SpO2 94%   BMI 25.63 kg/m   Vitals:   09/16/23 1019  BP: (!) 144/62  Pulse: 87  Height: 5'  5" (1.651 m)  Weight: 154 lb (69.9 kg)  SpO2: 94%  BMI (Calculated): 25.63    Physical Exam Vitals and nursing note reviewed.  Constitutional:      Appearance: Normal appearance. She is normal weight.  HENT:     Head: Normocephalic and atraumatic.     Nose: Nose normal.     Mouth/Throat:     Mouth: Mucous membranes are moist.  Eyes:     Extraocular Movements: Extraocular movements intact.     Conjunctiva/sclera: Conjunctivae normal.     Pupils: Pupils are equal, round, and reactive to light.  Cardiovascular:     Rate and Rhythm: Normal rate and regular rhythm.     Pulses: Normal pulses.     Heart sounds: Normal heart sounds.  Pulmonary:     Effort: Pulmonary effort is normal.     Breath sounds: Normal breath sounds.  Abdominal:     General: Abdomen is flat. Bowel sounds are normal.     Palpations: Abdomen is soft.  Musculoskeletal:        General: Normal range of motion.     Cervical back: Normal range of motion.  Skin:    General: Skin is warm and dry.  Neurological:     General: No focal deficit present.     Mental Status: She is alert and oriented to person, place, and time.  Psychiatric:        Mood and Affect: Mood normal.        Behavior: Behavior normal.        Thought Content: Thought content normal.        Judgment: Judgment normal.      No results found for any visits on 09/16/23.  Recent Results (from the past 2160 hours)  CMP14+EGFR     Status: Abnormal   Collection Time: 07/19/23  2:16 PM  Result  Value Ref Range   Glucose 92 70 - 99 mg/dL   BUN 20 8 - 27 mg/dL   Creatinine, Ser 4.09 (H) 0.57 - 1.00 mg/dL   eGFR 46 (L) >81 XB/JYN/8.29   BUN/Creatinine Ratio 16 12 - 28   Sodium 140 134 - 144 mmol/L   Potassium 3.9 3.5 - 5.2 mmol/L   Chloride 103 96 - 106 mmol/L   CO2 23 20 - 29 mmol/L   Calcium 10.1 8.7 - 10.3 mg/dL   Total Protein 7.2 6.0 - 8.5 g/dL   Albumin 4.4 3.9 - 4.9 g/dL   Globulin, Total 2.8 1.5 - 4.5 g/dL   Bilirubin Total 0.5 0.0 - 1.2 mg/dL   Alkaline Phosphatase 84 44 - 121 IU/L   AST 21 0 - 40 IU/L   ALT 14 0 - 32 IU/L  Hemoglobin A1c     Status: Abnormal   Collection Time: 07/19/23  2:16 PM  Result Value Ref Range   Hgb A1c MFr Bld 6.7 (H) 4.8 - 5.6 %    Comment:          Prediabetes: 5.7 - 6.4          Diabetes: >6.4          Glycemic control for adults with diabetes: <7.0    Est. average glucose Bld gHb Est-mCnc 146 mg/dL  Lipid Profile     Status: None   Collection Time: 07/19/23  2:16 PM  Result Value Ref Range   Cholesterol, Total 127 100 - 199 mg/dL   Triglycerides 82 0 - 149 mg/dL   HDL 40 >56 mg/dL  VLDL Cholesterol Cal 16 5 - 40 mg/dL   LDL Chol Calc (NIH) 71 0 - 99 mg/dL   Chol/HDL Ratio 3.2 0.0 - 4.4 ratio    Comment:                                   T. Chol/HDL Ratio                                             Men  Women                               1/2 Avg.Risk  3.4    3.3                                   Avg.Risk  5.0    4.4                                2X Avg.Risk  9.6    7.1                                3X Avg.Risk 23.4   11.0   TSH     Status: None   Collection Time: 07/19/23  2:16 PM  Result Value Ref Range   TSH 0.731 0.450 - 4.500 uIU/mL  CBC with Differential/Platelet     Status: Abnormal   Collection Time: 07/19/23  2:16 PM  Result Value Ref Range   WBC 7.4 3.4 - 10.8 x10E3/uL   RBC 4.78 3.77 - 5.28 x10E6/uL   Hemoglobin 12.3 11.1 - 15.9 g/dL   Hematocrit 52.8 41.3 - 46.6 %   MCV 80 79 - 97 fL   MCH 25.7 (L)  26.6 - 33.0 pg   MCHC 32.2 31.5 - 35.7 g/dL   RDW 24.4 01.0 - 27.2 %   Platelets 238 150 - 450 x10E3/uL   Neutrophils 58 Not Estab. %   Lymphs 28 Not Estab. %   Monocytes 8 Not Estab. %   Eos 5 Not Estab. %   Basos 1 Not Estab. %   Neutrophils Absolute 4.3 1.4 - 7.0 x10E3/uL   Lymphocytes Absolute 2.1 0.7 - 3.1 x10E3/uL   Monocytes Absolute 0.6 0.1 - 0.9 x10E3/uL   EOS (ABSOLUTE) 0.4 0.0 - 0.4 x10E3/uL   Basophils Absolute 0.1 0.0 - 0.2 x10E3/uL   Immature Granulocytes 0 Not Estab. %   Immature Grans (Abs) 0.0 0.0 - 0.1 x10E3/uL  POCT Glucose (CBG)     Status: Abnormal   Collection Time: 08/15/23 10:19 AM  Result Value Ref Range   POC Glucose 124 (A) 70 - 99 mg/dl      Assessment & Plan:  [facility] training today.  Problem List Items Addressed This Visit       Endocrine   Diabetes mellitus without complication (HCC) - Primary     Other   Weight loss counseling, encounter for    Return in about 4 weeks (around 10/14/2023).   Total time spent: 20 minutes  Google, NP  09/16/2023  This document may have been prepared by [employer] Voice Recognition software and as such may include unintentional dictation errors.

## 2023-09-19 ENCOUNTER — Other Ambulatory Visit: Payer: Self-pay

## 2023-09-19 MED ORDER — LOSARTAN POTASSIUM 100 MG PO TABS: 100.0000 mg | ORAL_TABLET | Freq: Every day | ORAL | 11 refills | Status: DC

## 2023-10-01 ENCOUNTER — Other Ambulatory Visit: Payer: Self-pay

## 2023-10-01 MED ORDER — AMLODIPINE BESYLATE 5 MG PO TABS: 5.0000 mg | ORAL_TABLET | Freq: Every day | ORAL | 11 refills | Status: AC

## 2023-10-14 ENCOUNTER — Ambulatory Visit (INDEPENDENT_AMBULATORY_CARE_PROVIDER_SITE_OTHER): Payer: 59 | Admitting: Cardiology

## 2023-10-14 ENCOUNTER — Encounter: Payer: Self-pay | Admitting: Cardiology

## 2023-10-14 VITALS — BP 110/62 | HR 93 | Ht 65.0 in | Wt 149.0 lb

## 2023-10-14 DIAGNOSIS — Z0130 Encounter for examination of blood pressure without abnormal findings: Secondary | ICD-10-CM

## 2023-10-14 DIAGNOSIS — Z713 Dietary counseling and surveillance: Secondary | ICD-10-CM | POA: Diagnosis not present

## 2023-10-14 DIAGNOSIS — E119 Type 2 diabetes mellitus without complications: Secondary | ICD-10-CM

## 2023-10-14 MED ORDER — MOUNJARO 5 MG/0.5ML ~~LOC~~ SOAJ: 5.0000 mg | SUBCUTANEOUS | 12 refills | Status: DC

## 2023-10-14 NOTE — Progress Notes (Signed)
 Established Patient Office Visit  Subjective:  Patient ID: Kristin Greer, female    DOB: January 23, 1956  Age: 68 y.o. MRN: [id number]  Chief Complaint  Patient presents with   Follow-up    4 Weeks Follow Up    Patient in office for 4 week follow up. Patient started on Mounjaro at 09/26/23 office visit. Patient is taking and tolerating medicion. States she is eating better, less bread, drinking more water. Patient down 5 lbs since previous visit. Will increase Mounjaro to 5 mg weekly.     No other concerns at this time.   Past Medical History:  Diagnosis Date   Anxiety    Arthritis    Bursitis    Constipation    Fibroids    Gout    Hemorrhoids    Hyperlipidemia    Hypertension    Hypothyroid    Migraine    Wears dentures    full upper and lower    Past Surgical History:  Procedure Laterality Date   CATARACT EXTRACTION W/PHACO Left 07/07/2020   Procedure: CATARACT EXTRACTION PHACO AND INTRAOCULAR LENS PLACEMENT (IOC) LEFT DIABETIC 4.12 00:34.6;  Surgeon: Galen Manila, MD;  Location: [facility];  Service: Ophthalmology;  Laterality: Left;  Diabetic - oral meds   CATARACT EXTRACTION W/PHACO Right 08/02/2020   Procedure: CATARACT EXTRACTION PHACO AND INTRAOCULAR LENS PLACEMENT (IOC) RIGHT DIABETIC 4.25 00:34.5;  Surgeon: Galen Manila, MD;  Location: [facility];  Service: Ophthalmology;  Laterality: Right;   DG TEETH FULL     all teeth pulled has partials   LAPAROSCOPIC SUPRACERVICAL HYSTERECTOMY  2012   turmors removed     one in [employer] yrs ago; 5-6 removed 2015 [facility]    Social History   Socioeconomic History   Marital status: Divorced    Spouse name: Not on file   Number of children: Not on file   Years of education: Not on file   Highest education level: Not on file  Occupational History   Not on file  Tobacco Use   Smoking status: Former    Current packs/day: 0.00    Types: Cigarettes    Quit date: 09/28/2019    Years since  quitting: 4.0   Smokeless tobacco: Never  Vaping Use   Vaping status: Former  Substance and Sexual Activity   Alcohol use: Yes    Comment: rarely   Drug use: Never   Sexual activity: Not Currently    Birth control/protection: Post-menopausal  Other Topics Concern   Not on file  Social History Narrative   Not on file   Social Drivers of Health   Financial Resource Strain: Not on file  Food Insecurity: Not on file  Transportation Needs: Not on file  Physical Activity: Not on file  Stress: Not on file  Social Connections: Not on file  Intimate Partner Violence: Not on file    Family History  Problem Relation Age of Onset   Breast cancer Sister 41   Lupus Sister     Allergies  Allergen Reactions   Varenicline     Note: patient sleepwalks    Outpatient Medications Prior to Visit  Medication Sig   acetaminophen (TYLENOL) 650 MG CR tablet Take 1,300 mg by mouth every 8 (eight) hours as needed for pain.   albuterol (VENTOLIN HFA) 108 (90 Base) MCG/ACT inhaler INHALE 1 PUFF BY MOUTH EVERY 6 HOURS AS NEEDED FOR SHORTNESS OF BREATH AND OR WHEEZING   amLODipine (NORVASC) 5 MG tablet Take  1 tablet (5 mg total) by mouth daily.   aspirin EC (ASPIRIN LOW DOSE) 81 MG tablet TAKE 1 TABLET BY MOUTH DAILY   aspirin-acetaminophen-caffeine (EXCEDRIN MIGRAINE) 250-250-65 MG tablet Take 2 tablets by mouth every 6 (six) hours as needed for headache.   atorvastatin (LIPITOR) 40 MG tablet TAKE 1 TABLET BY MOUTH NIGHTLY AT BEDTIME FOR CHOLESTEROL (NOTE INCREASED DOSAGE)   benztropine (COGENTIN) 1 MG tablet Take 1 mg by mouth 2 (two) times daily.   calcium carbonate (CALCIUM 600) 1500 (600 Ca) MG TABS tablet TAKE 1 TABLET BY MOUTH DAILY   Calcium Carbonate-Vit D-Min (CALTRATE 600+D PLUS MINERALS) 600-800 MG-UNIT CHEW Caltrate 600+D Plus Minerals 600-800 MG-UNIT Oral Tablet Chewable QTY: 0 tablet Days: 0 Refills: 0  Written: 06/04/18 Patient Instructions: qd   cetirizine (ZYRTEC) 10 MG tablet TAKE  1 TABLET BY MOUTH AT BEDTIME FOR ALLERGIES   cloNIDine (CATAPRES) 0.1 MG tablet Take 1 tablet (0.1 mg total) by mouth daily.   fluPHENAZine (PROLIXIN) 10 MG tablet Take 10 mg by mouth 3 (three) times daily.   fluticasone (FLONASE) 50 MCG/ACT nasal spray Place 2 sprays into both nostrils daily.   gabapentin (NEURONTIN) 100 MG capsule TAKE 1 CAPSULE BY MOUTH IN THE MORNING AND 1 CAPSULE BY MOUTH AT BEDTIME FOR TINGLING SENSATION   glipiZIDE (GLUCOTROL XL) 10 MG 24 hr tablet TAKE 1 TABLET BY MOUTH DAILY WITH BREAKFAST   HYDROXYZINE PAMOATE PO Take 25 mg by mouth 2 (two) times daily.   levothyroxine (SYNTHROID) 50 MCG tablet TAKE 1 TABLET BY  MOUTH EVERY MORNING ONAN EMPTY STOMACH 1 HOUR PRIOR TO BREAKFAST   losartan (COZAAR) 100 MG tablet Take 1 tablet (100 mg total) by mouth daily.   montelukast (SINGULAIR) 10 MG tablet TAKE 1 TABLET BY MOUTH DAILY FOR (NASAL CONGESTION)   naproxen (NAPROSYN) 500 MG tablet TAKE 1 TABLET BY MOUTH 2 TIMES DAILY WITH A MEAL   nicotine polacrilex (NICORETTE) 4 MG gum CHEW 1 GUM PIECE FOR NICOTINE CRAVING ASNEEDED (UP TO 6 PIECES PER DAY)   OLANZapine (ZYPREXA) 20 MG tablet Take 20 mg by mouth at bedtime.   Omega-3 Fatty Acids (SM FISH OIL) 1000 MG CAPS Take 1 capsule by mouth 2 (two) times daily.   senna (SENOKOT) 8.6 MG tablet Take 1 tablet by mouth daily.   STIOLTO RESPIMAT 2.5-2.5 MCG/ACT AERS SMARTSIG:2 Puff(s) Via Inhaler Daily   traZODone (DESYREL) 50 MG tablet Take 1 tablet by mouth 2 (two) times daily.   Vitamin D, Ergocalciferol, (DRISDOL) 1.25 MG (50000 UNIT) CAPS capsule TAKE 1 CAPSULE BY MOUTH WEEKLY FOR LOW VITAMIN D   [DISCONTINUED] tirzepatide (MOUNJARO) 2.5 MG/0.5ML Pen Inject 2.5 mg into the skin once a week.   No facility-administered medications prior to visit.    Review of Systems  Constitutional: Negative.   HENT: Negative.    Eyes: Negative.   Respiratory: Negative.  Negative for shortness of breath.   Cardiovascular: Negative.  Negative  for chest pain.  Gastrointestinal: Negative.  Negative for abdominal pain, constipation and diarrhea.  Genitourinary: Negative.   Musculoskeletal:  Negative for joint pain and myalgias.  Skin: Negative.   Neurological: Negative.  Negative for dizziness and headaches.  Endo/Heme/Allergies: Negative.   All other systems reviewed and are negative.      Objective:   BP 110/62   Pulse 93   Ht 5\' 5"  (1.651 m)   Wt 149 lb (67.6 kg)   SpO2 95%   BMI 24.79 kg/m   Vitals:  10/14/23 1057  BP: 110/62  Pulse: 93  Height: 5\' 5"  (1.651 m)  Weight: 149 lb (67.6 kg)  SpO2: 95%  BMI (Calculated): 24.79    Physical Exam Vitals and nursing note reviewed.  Constitutional:      Appearance: Normal appearance. She is normal weight.  HENT:     Head: Normocephalic and atraumatic.     Nose: Nose normal.     Mouth/Throat:     Mouth: Mucous membranes are moist.  Eyes:     Extraocular Movements: Extraocular movements intact.     Conjunctiva/sclera: Conjunctivae normal.     Pupils: Pupils are equal, round, and reactive to light.  Cardiovascular:     Rate and Rhythm: Normal rate and regular rhythm.     Pulses: Normal pulses.     Heart sounds: Normal heart sounds.  Pulmonary:     Effort: Pulmonary effort is normal.     Breath sounds: Normal breath sounds.  Abdominal:     General: Abdomen is flat. Bowel sounds are normal.     Palpations: Abdomen is soft.  Musculoskeletal:        General: Normal range of motion.     Cervical back: Normal range of motion.  Skin:    General: Skin is warm and dry.  Neurological:     General: No focal deficit present.     Mental Status: She is alert and oriented to person, place, and time.  Psychiatric:        Mood and Affect: Mood normal.        Behavior: Behavior normal.        Thought Content: Thought content normal.        Judgment: Judgment normal.      No results found for any visits on 10/14/23.  Recent Results (from the past 2160 hours)   CMP14+EGFR     Status: Abnormal   Collection Time: 07/19/23  2:16 PM  Result Value Ref Range   Glucose 92 70 - 99 mg/dL   BUN 20 8 - 27 mg/dL   Creatinine, Ser 6.29 (H) 0.57 - 1.00 mg/dL   eGFR 46 (L) >52 WU/XLK/4.40   BUN/Creatinine Ratio 16 12 - 28   Sodium 140 134 - 144 mmol/L   Potassium 3.9 3.5 - 5.2 mmol/L   Chloride 103 96 - 106 mmol/L   CO2 23 20 - 29 mmol/L   Calcium 10.1 8.7 - 10.3 mg/dL   Total Protein 7.2 6.0 - 8.5 g/dL   Albumin 4.4 3.9 - 4.9 g/dL   Globulin, Total 2.8 1.5 - 4.5 g/dL   Bilirubin Total 0.5 0.0 - 1.2 mg/dL   Alkaline Phosphatase 84 44 - 121 IU/L   AST 21 0 - 40 IU/L   ALT 14 0 - 32 IU/L  Hemoglobin A1c     Status: Abnormal   Collection Time: 07/19/23  2:16 PM  Result Value Ref Range   Hgb A1c MFr Bld 6.7 (H) 4.8 - 5.6 %    Comment:          Prediabetes: 5.7 - 6.4          Diabetes: >6.4          Glycemic control for adults with diabetes: <7.0    Est. average glucose Bld gHb Est-mCnc 146 mg/dL  Lipid Profile     Status: None   Collection Time: 07/19/23  2:16 PM  Result Value Ref Range   Cholesterol, Total 127 100 - 199 mg/dL   Triglycerides  82 0 - 149 mg/dL   HDL 40 >44 mg/dL   VLDL Cholesterol Cal 16 5 - 40 mg/dL   LDL Chol Calc (NIH) 71 0 - 99 mg/dL   Chol/HDL Ratio 3.2 0.0 - 4.4 ratio    Comment:                                   T. Chol/HDL Ratio                                             Men  Women                               1/2 Avg.Risk  3.4    3.3                                   Avg.Risk  5.0    4.4                                2X Avg.Risk  9.6    7.1                                3X Avg.Risk 23.4   11.0   TSH     Status: None   Collection Time: 07/19/23  2:16 PM  Result Value Ref Range   TSH 0.731 0.450 - 4.500 uIU/mL  CBC with Differential/Platelet     Status: Abnormal   Collection Time: 07/19/23  2:16 PM  Result Value Ref Range   WBC 7.4 3.4 - 10.8 x10E3/uL   RBC 4.78 3.77 - 5.28 x10E6/uL   Hemoglobin 12.3 11.1 -  15.9 g/dL   Hematocrit 03.4 74.2 - 46.6 %   MCV 80 79 - 97 fL   MCH 25.7 (L) 26.6 - 33.0 pg   MCHC 32.2 31.5 - 35.7 g/dL   RDW 59.5 63.8 - 75.6 %   Platelets 238 150 - 450 x10E3/uL   Neutrophils 58 Not Estab. %   Lymphs 28 Not Estab. %   Monocytes 8 Not Estab. %   Eos 5 Not Estab. %   Basos 1 Not Estab. %   Neutrophils Absolute 4.3 1.4 - 7.0 x10E3/uL   Lymphocytes Absolute 2.1 0.7 - 3.1 x10E3/uL   Monocytes Absolute 0.6 0.1 - 0.9 x10E3/uL   EOS (ABSOLUTE) 0.4 0.0 - 0.4 x10E3/uL   Basophils Absolute 0.1 0.0 - 0.2 x10E3/uL   Immature Granulocytes 0 Not Estab. %   Immature Grans (Abs) 0.0 0.0 - 0.1 x10E3/uL  POCT Glucose (CBG)     Status: Abnormal   Collection Time: 08/15/23 10:19 AM  Result Value Ref Range   POC Glucose 124 (A) 70 - 99 mg/dl      Assessment & Plan:  Increase Mounjaro to 5 mg weekly.  Problem List Items Addressed This Visit       Endocrine   Diabetes mellitus without complication (HCC) - Primary   Relevant Medications   tirzepatide (MOUNJARO) 5 MG/0.5ML Pen     Other   Weight loss counseling,  encounter for    Return in about 3 months (around 01/14/2024) for with fasting labs prior.   Total time spent: 25 minutes  Google, NP  10/14/2023   This document may have been prepared by Dragon Voice Recognition software and as such may include unintentional dictation errors.

## 2023-10-28 ENCOUNTER — Other Ambulatory Visit: Payer: Self-pay | Admitting: Internal Medicine

## 2023-11-14 ENCOUNTER — Other Ambulatory Visit: Payer: Self-pay | Admitting: Internal Medicine

## 2023-12-02 ENCOUNTER — Other Ambulatory Visit: Payer: Self-pay | Admitting: Cardiology

## 2023-12-11 ENCOUNTER — Other Ambulatory Visit: Payer: Self-pay | Admitting: Cardiology

## 2023-12-17 DIAGNOSIS — M47816 Spondylosis without myelopathy or radiculopathy, lumbar region: Secondary | ICD-10-CM | POA: Diagnosis not present

## 2023-12-17 DIAGNOSIS — R768 Other specified abnormal immunological findings in serum: Secondary | ICD-10-CM | POA: Diagnosis not present

## 2023-12-17 DIAGNOSIS — M47812 Spondylosis without myelopathy or radiculopathy, cervical region: Secondary | ICD-10-CM | POA: Diagnosis not present

## 2023-12-30 ENCOUNTER — Telehealth: Payer: Self-pay

## 2023-12-30 ENCOUNTER — Other Ambulatory Visit: Payer: Self-pay | Admitting: Cardiology

## 2023-12-30 MED ORDER — AZITHROMYCIN 250 MG PO TABS: ORAL_TABLET | ORAL | 0 refills | Status: AC

## 2023-12-30 NOTE — Telephone Encounter (Signed)
 Patient called stating she's got a runny nose, cough, sneezing, congestion, chills she is asking for something to be called into [facility] for her, she is on transportation so she is unable to come in for any testing

## 2023-12-30 NOTE — Telephone Encounter (Signed)
 Patient informed.

## 2024-01-09 ENCOUNTER — Other Ambulatory Visit: Payer: Self-pay | Admitting: Family Medicine

## 2024-01-09 DIAGNOSIS — Z5986 Financial insecurity: Secondary | ICD-10-CM | POA: Diagnosis not present

## 2024-01-09 DIAGNOSIS — M5412 Radiculopathy, cervical region: Secondary | ICD-10-CM | POA: Diagnosis not present

## 2024-01-09 DIAGNOSIS — M5416 Radiculopathy, lumbar region: Secondary | ICD-10-CM

## 2024-01-09 DIAGNOSIS — Z5941 Food insecurity: Secondary | ICD-10-CM | POA: Diagnosis not present

## 2024-01-09 DIAGNOSIS — M19011 Primary osteoarthritis, right shoulder: Secondary | ICD-10-CM | POA: Diagnosis not present

## 2024-01-15 ENCOUNTER — Other Ambulatory Visit

## 2024-01-15 DIAGNOSIS — E782 Mixed hyperlipidemia: Secondary | ICD-10-CM

## 2024-01-15 DIAGNOSIS — I10 Essential (primary) hypertension: Secondary | ICD-10-CM

## 2024-01-15 DIAGNOSIS — E119 Type 2 diabetes mellitus without complications: Secondary | ICD-10-CM

## 2024-01-15 DIAGNOSIS — E039 Hypothyroidism, unspecified: Secondary | ICD-10-CM

## 2024-01-16 ENCOUNTER — Ambulatory Visit: Payer: Self-pay | Admitting: Cardiology

## 2024-01-16 LAB — CMP14+EGFR
ALT: 16 IU/L (ref 0–32)
AST: 19 IU/L (ref 0–40)
Albumin: 4.2 g/dL (ref 3.9–4.9)
Alkaline Phosphatase: 75 IU/L (ref 44–121)
BUN/Creatinine Ratio: 8 — ABNORMAL LOW (ref 12–28)
BUN: 10 mg/dL (ref 8–27)
Bilirubin Total: 0.4 mg/dL (ref 0.0–1.2)
CO2: 23 mmol/L (ref 20–29)
Calcium: 9.5 mg/dL (ref 8.7–10.3)
Chloride: 104 mmol/L (ref 96–106)
Creatinine, Ser: 1.33 mg/dL — ABNORMAL HIGH (ref 0.57–1.00)
Globulin, Total: 2.7 g/dL (ref 1.5–4.5)
Glucose: 102 mg/dL — ABNORMAL HIGH (ref 70–99)
Potassium: 4 mmol/L (ref 3.5–5.2)
Sodium: 140 mmol/L (ref 134–144)
Total Protein: 6.9 g/dL (ref 6.0–8.5)
eGFR: 43 mL/min/{1.73_m2} — ABNORMAL LOW (ref >59)

## 2024-01-16 LAB — CBC WITH DIFFERENTIAL/PLATELET
Basophils Absolute: 0.1 10*3/uL (ref 0.0–0.2)
Basos: 1 %
EOS (ABSOLUTE): 0.4 10*3/uL (ref 0.0–0.4)
Eos: 5 %
Hematocrit: 37.9 % (ref 34.0–46.6)
Hemoglobin: 11.6 g/dL (ref 11.1–15.9)
Immature Grans (Abs): 0 10*3/uL (ref 0.0–0.1)
Immature Granulocytes: 0 %
Lymphocytes Absolute: 2.1 10*3/uL (ref 0.7–3.1)
Lymphs: 29 %
MCH: 25.8 pg — ABNORMAL LOW (ref 26.6–33.0)
MCHC: 30.6 g/dL — ABNORMAL LOW (ref 31.5–35.7)
MCV: 84 fL (ref 79–97)
Monocytes Absolute: 0.9 10*3/uL (ref 0.1–0.9)
Monocytes: 12 %
Neutrophils Absolute: 3.8 10*3/uL (ref 1.4–7.0)
Neutrophils: 53 %
Platelets: 207 10*3/uL (ref 150–450)
RBC: 4.5 x10E6/uL (ref 3.77–5.28)
RDW: 15.9 % — ABNORMAL HIGH (ref 11.7–15.4)
WBC: 7.2 10*3/uL (ref 3.4–10.8)

## 2024-01-16 LAB — LIPID PANEL
Chol/HDL Ratio: 2.4 ratio (ref 0.0–4.4)
Cholesterol, Total: 109 mg/dL (ref 100–199)
HDL: 46 mg/dL (ref >39)
LDL Chol Calc (NIH): 49 mg/dL (ref 0–99)
Triglycerides: 64 mg/dL (ref 0–149)
VLDL Cholesterol Cal: 14 mg/dL (ref 5–40)

## 2024-01-16 LAB — HEMOGLOBIN A1C
Est. average glucose Bld gHb Est-mCnc: 117 mg/dL
Hgb A1c MFr Bld: 5.7 % — ABNORMAL HIGH (ref 4.8–5.6)

## 2024-01-16 LAB — TSH: TSH: 0.81 u[IU]/mL (ref 0.450–4.500)

## 2024-01-17 ENCOUNTER — Other Ambulatory Visit

## 2024-01-21 ENCOUNTER — Ambulatory Visit: Admitting: Cardiology

## 2024-01-21 ENCOUNTER — Other Ambulatory Visit: Payer: Self-pay | Admitting: Family

## 2024-01-21 DIAGNOSIS — E119 Type 2 diabetes mellitus without complications: Secondary | ICD-10-CM

## 2024-02-05 ENCOUNTER — Other Ambulatory Visit

## 2024-02-08 ENCOUNTER — Other Ambulatory Visit: Payer: Self-pay | Admitting: Cardiology

## 2024-02-08 ENCOUNTER — Other Ambulatory Visit: Payer: Self-pay | Admitting: Internal Medicine

## 2024-02-10 DIAGNOSIS — Z87891 Personal history of nicotine dependence: Secondary | ICD-10-CM | POA: Diagnosis not present

## 2024-02-10 DIAGNOSIS — J449 Chronic obstructive pulmonary disease, unspecified: Secondary | ICD-10-CM | POA: Diagnosis not present

## 2024-02-11 ENCOUNTER — Other Ambulatory Visit: Payer: Self-pay | Admitting: Specialist

## 2024-02-11 DIAGNOSIS — J449 Chronic obstructive pulmonary disease, unspecified: Secondary | ICD-10-CM

## 2024-02-11 DIAGNOSIS — Z87891 Personal history of nicotine dependence: Secondary | ICD-10-CM

## 2024-02-13 ENCOUNTER — Ambulatory Visit (INDEPENDENT_AMBULATORY_CARE_PROVIDER_SITE_OTHER): Admitting: Cardiology

## 2024-02-13 ENCOUNTER — Encounter: Payer: Self-pay | Admitting: Cardiology

## 2024-02-13 ENCOUNTER — Ambulatory Visit: Payer: Self-pay | Admitting: Cardiology

## 2024-02-13 VITALS — BP 120/68 | HR 86 | Ht 65.0 in | Wt 148.0 lb

## 2024-02-13 DIAGNOSIS — E782 Mixed hyperlipidemia: Secondary | ICD-10-CM | POA: Diagnosis not present

## 2024-02-13 DIAGNOSIS — Z713 Dietary counseling and surveillance: Secondary | ICD-10-CM | POA: Diagnosis not present

## 2024-02-13 DIAGNOSIS — I10 Essential (primary) hypertension: Secondary | ICD-10-CM

## 2024-02-13 DIAGNOSIS — E039 Hypothyroidism, unspecified: Secondary | ICD-10-CM

## 2024-02-13 DIAGNOSIS — E119 Type 2 diabetes mellitus without complications: Secondary | ICD-10-CM

## 2024-02-13 LAB — POCT CBG (FASTING - GLUCOSE)-MANUAL ENTRY: Glucose Fasting, POC: 146 mg/dL — AB (ref 70–99)

## 2024-02-13 MED ORDER — MOUNJARO 7.5 MG/0.5ML ~~LOC~~ SOAJ: 7.5000 mg | SUBCUTANEOUS | 4 refills | Status: DC

## 2024-02-13 NOTE — Progress Notes (Signed)
 Established Patient Office Visit  Subjective:  Patient ID: Kristin Greer, female    DOB: 12-Oct-1955  Age: 68 y.o. MRN: [id number]  Chief Complaint  Patient presents with   Follow-up    3 month lab results    Patient in office for 3 month follow up, discuss recent lab results. Patient doing well, no complaints today.  Taking and tolerating Mounjaro . Will increase to 7.5 mg weekly. Discussed recent lab work. LDL and Hgb A1c much improved. Kidney function slightly worse, increase water intake.  Continue same medications.     No other concerns at this time.   Past Medical History:  Diagnosis Date   Anxiety    Arthritis    Bursitis    Constipation    Fibroids    Gout    Hemorrhoids    Hyperlipidemia    Hypertension    Hypothyroid    Migraine    Wears dentures    full upper and lower    Past Surgical History:  Procedure Laterality Date   CATARACT EXTRACTION W/PHACO Left 07/07/2020   Procedure: CATARACT EXTRACTION PHACO AND INTRAOCULAR LENS PLACEMENT (IOC) LEFT DIABETIC 4.12 00:34.6;  Surgeon: Jaye Fallow, MD;  Location: [facility];  Service: Ophthalmology;  Laterality: Left;  Diabetic - oral meds   CATARACT EXTRACTION W/PHACO Right 08/02/2020   Procedure: CATARACT EXTRACTION PHACO AND INTRAOCULAR LENS PLACEMENT (IOC) RIGHT DIABETIC 4.25 00:34.5;  Surgeon: Jaye Fallow, MD;  Location: [facility];  Service: Ophthalmology;  Laterality: Right;   DG TEETH FULL     all teeth pulled has partials   LAPAROSCOPIC SUPRACERVICAL HYSTERECTOMY  2012   turmors removed     one in [employer] yrs ago; 5-6 removed 2015 [facility]    Social History   Socioeconomic History   Marital status: Divorced    Spouse name: Not on file   Number of children: Not on file   Years of education: Not on file   Highest education level: Not on file  Occupational History   Not on file  Tobacco Use   Smoking status: Former    Current packs/day: 0.00    Types: Cigarettes     Quit date: 09/28/2019    Years since quitting: 4.3   Smokeless tobacco: Never  Vaping Use   Vaping status: Former  Substance and Sexual Activity   Alcohol use: Yes    Comment: rarely   Drug use: Never   Sexual activity: Not Currently    Birth control/protection: Post-menopausal  Other Topics Concern   Not on file  Social History Narrative   Not on file   Social Drivers of Health   Financial Resource Strain: Medium Risk (01/09/2024)   Received from [facility]   Overall Financial Resource Strain (CARDIA)    Difficulty of Paying Living Expenses: Somewhat hard  Food Insecurity: Food Insecurity Present (01/09/2024)   Received from [facility]   Hunger Vital Sign    Within the past 12 months, you worried that your food would run out before you got the money to buy more.: Often true    Within the past 12 months, the food you bought just didn't last and you didn't have money to get more.: Often true  Transportation Needs: No Transportation Needs (01/09/2024)   Received from [facility]    In the past 12 months, has lack of transportation kept you from medical appointments or from getting medications?: No  Lack of Transportation (Non-Medical): No  Physical Activity: Not on file  Stress: Not on file  Social Connections: Not on file  Intimate Partner Violence: Not on file    Family History  Problem Relation Age of Onset   Breast cancer Sister 16   Lupus Sister     Allergies  Allergen Reactions   Varenicline     Note: patient sleepwalks    Outpatient Medications Prior to Visit  Medication Sig   acetaminophen  (TYLENOL ) 650 MG CR tablet Take 1,300 mg by mouth every 8 (eight) hours as needed for pain.   albuterol (VENTOLIN HFA) 108 (90 Base) MCG/ACT inhaler INHALE 1 PUFF BY MOUTH EVERY 6 HOURS AS NEEDED FOR SHORTNESS OF BREATH AND OR WHEEZING   amLODipine  (NORVASC ) 5 MG tablet Take 1 tablet (5 mg  total) by mouth daily.   aspirin EC (ASPIRIN LOW DOSE) 81 MG tablet TAKE 1 TABLET BY MOUTH DAILY   aspirin-acetaminophen -caffeine (EXCEDRIN MIGRAINE) 250-250-65 MG tablet Take 2 tablets by mouth every 6 (six) hours as needed for headache.   atorvastatin (LIPITOR) 40 MG tablet TAKE 1 TABLET BY MOUTH NIGHTLY AT BEDTIME FOR CHOLESTEROL (NOTE INCREASED DOSAGE)   benztropine (COGENTIN) 1 MG tablet Take 1 mg by mouth 2 (two) times daily.   calcium carbonate (CALCIUM 600) 1500 (600 Ca) MG TABS tablet TAKE 1 TABLET BY MOUTH DAILY   Calcium Carbonate-Vit D-Min (CALTRATE 600+D PLUS MINERALS) 600-800 MG-UNIT CHEW Caltrate 600+D Plus Minerals 600-800 MG-UNIT Oral Tablet Chewable QTY: 0 tablet Days: 0 Refills: 0  Written: 06/04/18 Patient Instructions: qd   cetirizine (ZYRTEC) 10 MG tablet TAKE 1 TABLET BY MOUTH AT BEDTIME FOR ALLERGIES   cloNIDine  (CATAPRES ) 0.1 MG tablet Take 1 tablet (0.1 mg total) by mouth daily.   fluPHENAZine (PROLIXIN) 10 MG tablet Take 10 mg by mouth 3 (three) times daily.   fluticasone  (FLONASE ) 50 MCG/ACT nasal spray Place 2 sprays into both nostrils daily.   gabapentin (NEURONTIN) 100 MG capsule TAKE 1 CAPSULE BY MOUTH IN THE MORNING AND 1 CAPSULE BY MOUTH AT BEDTIME FOR TINGLING SENSATION   glipiZIDE  (GLUCOTROL  XL) 10 MG 24 hr tablet TAKE 1 TABLET BY MOUTH DAILY WITH BREAKFAST   HYDROXYZINE PAMOATE PO Take 25 mg by mouth 2 (two) times daily.   levothyroxine (SYNTHROID) 50 MCG tablet TAKE 1 TABLET BY  MOUTH EVERY MORNING ONAN EMPTY STOMACH 1 HOUR PRIOR TO BREAKFAST   losartan  (COZAAR ) 100 MG tablet Take 1 tablet (100 mg total) by mouth daily.   montelukast (SINGULAIR) 10 MG tablet TAKE 1 TABLET BY MOUTH DAILY FOR (NASAL CONGESTION)   naproxen  (NAPROSYN ) 500 MG tablet TAKE 1 TABLET BY MOUTH 2 TIMES DAILY WITH A MEAL   nicotine polacrilex (NICORETTE) 4 MG gum CHEW 1 GUM PIECE FOR NICOTINE CRAVING ASNEEDED (UP TO 6 PIECES PER DAY)   OLANZapine (ZYPREXA) 20 MG tablet Take 20 mg by mouth  at bedtime.   Omega-3 Fatty Acids (SM FISH OIL) 1000 MG CAPS Take 1 capsule by mouth 2 (two) times daily.   senna (SENOKOT) 8.6 MG tablet Take 1 tablet by mouth daily.   STIOLTO RESPIMAT 2.5-2.5 MCG/ACT AERS SMARTSIG:2 Puff(s) Via Inhaler Daily   traZODone (DESYREL) 50 MG tablet Take 1 tablet by mouth 2 (two) times daily.   Vitamin D, Ergocalciferol, (DRISDOL) 1.25 MG (50000 UNIT) CAPS capsule TAKE 1 CAPSULE BY MOUTH WEEKLY FOR LOW VITAMIN D   [DISCONTINUED] tirzepatide  (MOUNJARO ) 5 MG/0.5ML Pen Inject 5 mg into the skin once a week.  No facility-administered medications prior to visit.    Review of Systems  Constitutional: Negative.   HENT: Negative.    Eyes: Negative.   Respiratory: Negative.  Negative for shortness of breath.   Cardiovascular: Negative.  Negative for chest pain.  Gastrointestinal: Negative.  Negative for abdominal pain, constipation and diarrhea.  Genitourinary: Negative.   Musculoskeletal:  Negative for joint pain and myalgias.  Skin: Negative.   Neurological: Negative.  Negative for dizziness and headaches.  Endo/Heme/Allergies: Negative.   All other systems reviewed and are negative.      Objective:   BP 120/68   Pulse 86   Ht 5' 5 (1.651 m)   Wt 148 lb (67.1 kg)   SpO2 95%   BMI 24.63 kg/m   Vitals:   02/13/24 1304  BP: 120/68  Pulse: 86  Height: 5' 5 (1.651 m)  Weight: 148 lb (67.1 kg)  SpO2: 95%  BMI (Calculated): 24.63    Physical Exam Vitals and nursing note reviewed.  Constitutional:      Appearance: Normal appearance. She is normal weight.  HENT:     Head: Normocephalic and atraumatic.     Nose: Nose normal.     Mouth/Throat:     Mouth: Mucous membranes are moist.  Eyes:     Extraocular Movements: Extraocular movements intact.     Conjunctiva/sclera: Conjunctivae normal.     Pupils: Pupils are equal, round, and reactive to light.  Cardiovascular:     Rate and Rhythm: Normal rate and regular rhythm.     Pulses: Normal  pulses.     Heart sounds: Normal heart sounds.  Pulmonary:     Effort: Pulmonary effort is normal.     Breath sounds: Normal breath sounds.  Abdominal:     General: Abdomen is flat. Bowel sounds are normal.     Palpations: Abdomen is soft.  Musculoskeletal:        General: Normal range of motion.     Cervical back: Normal range of motion.  Skin:    General: Skin is warm and dry.  Neurological:     General: No focal deficit present.     Mental Status: She is alert and oriented to person, place, and time.  Psychiatric:        Mood and Affect: Mood normal.        Behavior: Behavior normal.        Thought Content: Thought content normal.        Judgment: Judgment normal.      Results for orders placed or performed in visit on 02/13/24  POCT CBG (Fasting - Glucose)  Result Value Ref Range   Glucose Fasting, POC 146 (A) 70 - 99 mg/dL    Recent Results (from the past 2160 hours)  CMP14+EGFR     Status: Abnormal   Collection Time: 01/15/24  9:47 AM  Result Value Ref Range   Glucose 102 (H) 70 - 99 mg/dL   BUN 10 8 - 27 mg/dL   Creatinine, Ser 8.66 (H) 0.57 - 1.00 mg/dL   eGFR 43 (L) >40 fO/fpw/8.26   BUN/Creatinine Ratio 8 (L) 12 - 28   Sodium 140 134 - 144 mmol/L   Potassium 4.0 3.5 - 5.2 mmol/L   Chloride 104 96 - 106 mmol/L   CO2 23 20 - 29 mmol/L   Calcium 9.5 8.7 - 10.3 mg/dL   Total Protein 6.9 6.0 - 8.5 g/dL   Albumin 4.2 3.9 - 4.9 g/dL   Globulin, Total  2.7 1.5 - 4.5 g/dL   Bilirubin Total 0.4 0.0 - 1.2 mg/dL   Alkaline Phosphatase 75 44 - 121 IU/L   AST 19 0 - 40 IU/L   ALT 16 0 - 32 IU/L  Lipid Profile     Status: None   Collection Time: 01/15/24  9:47 AM  Result Value Ref Range   Cholesterol, Total 109 100 - 199 mg/dL   Triglycerides 64 0 - 149 mg/dL   HDL 46 >60 mg/dL   VLDL Cholesterol Cal 14 5 - 40 mg/dL   LDL Chol Calc (NIH) 49 0 - 99 mg/dL   Chol/HDL Ratio 2.4 0.0 - 4.4 ratio    Comment:                                   T. Chol/HDL Ratio                                              Men  Women                               1/2 Avg.Risk  3.4    3.3                                   Avg.Risk  5.0    4.4                                2X Avg.Risk  9.6    7.1                                3X Avg.Risk 23.4   11.0   CBC with Differential/Platelet     Status: Abnormal   Collection Time: 01/15/24  9:47 AM  Result Value Ref Range   WBC 7.2 3.4 - 10.8 x10E3/uL   RBC 4.50 3.77 - 5.28 x10E6/uL   Hemoglobin 11.6 11.1 - 15.9 g/dL   Hematocrit 62.0 65.9 - 46.6 %   MCV 84 79 - 97 fL   MCH 25.8 (L) 26.6 - 33.0 pg   MCHC 30.6 (L) 31.5 - 35.7 g/dL   RDW 84.0 (H) 88.2 - 84.5 %   Platelets 207 150 - 450 x10E3/uL   Neutrophils 53 Not Estab. %   Lymphs 29 Not Estab. %   Monocytes 12 Not Estab. %   Eos 5 Not Estab. %   Basos 1 Not Estab. %   Neutrophils Absolute 3.8 1.4 - 7.0 x10E3/uL   Lymphocytes Absolute 2.1 0.7 - 3.1 x10E3/uL   Monocytes Absolute 0.9 0.1 - 0.9 x10E3/uL   EOS (ABSOLUTE) 0.4 0.0 - 0.4 x10E3/uL   Basophils Absolute 0.1 0.0 - 0.2 x10E3/uL   Immature Granulocytes 0 Not Estab. %   Immature Grans (Abs) 0.0 0.0 - 0.1 x10E3/uL  Hemoglobin A1c     Status: Abnormal   Collection Time: 01/15/24  9:47 AM  Result Value Ref Range   Hgb A1c MFr Bld 5.7 (H) 4.8 - 5.6 %    Comment:  Prediabetes: 5.7 - 6.4          Diabetes: >6.4          Glycemic control for adults with diabetes: <7.0    Est. average glucose Bld gHb Est-mCnc 117 mg/dL  TSH     Status: None   Collection Time: 01/15/24  9:47 AM  Result Value Ref Range   TSH 0.810 0.450 - 4.500 uIU/mL  POCT CBG (Fasting - Glucose)     Status: Abnormal   Collection Time: 02/13/24  1:11 PM  Result Value Ref Range   Glucose Fasting, POC 146 (A) 70 - 99 mg/dL      Assessment & Plan:  Increase Mounjaro  to 7.5 mg weekly  Problem List Items Addressed This Visit       Cardiovascular and Mediastinum   Primary hypertension     Endocrine   Diabetes mellitus without complication  (HCC) - Primary   Relevant Medications   tirzepatide  (MOUNJARO ) 7.5 MG/0.5ML Pen   Other Relevant Orders   POCT CBG (Fasting - Glucose) (Completed)   Hypothyroidism, adult     Other   Mixed hyperlipidemia   Weight loss counseling, encounter for    Return in about 4 months (around 06/15/2024) for fasting labs prior.   Total time spent: 25 minutes  Google, NP  02/13/2024   This document may have been prepared by Dragon Voice Recognition software and as such may include unintentional dictation errors.

## 2024-02-17 ENCOUNTER — Other Ambulatory Visit: Payer: Self-pay | Admitting: Internal Medicine

## 2024-02-18 ENCOUNTER — Ambulatory Visit
Admission: RE | Admit: 2024-02-18 | Discharge: 2024-02-18 | Disposition: A | Discharge status: Home / Self Care | Source: Ambulatory Visit | Attending: Family Medicine | Admitting: Family Medicine

## 2024-02-18 ENCOUNTER — Other Ambulatory Visit: Payer: Self-pay | Admitting: Cardiology

## 2024-02-18 DIAGNOSIS — M5416 Radiculopathy, lumbar region: Secondary | ICD-10-CM | POA: Diagnosis not present

## 2024-02-18 DIAGNOSIS — M48061 Spinal stenosis, lumbar region without neurogenic claudication: Secondary | ICD-10-CM | POA: Diagnosis not present

## 2024-02-19 ENCOUNTER — Other Ambulatory Visit: Payer: Self-pay | Admitting: Cardiology

## 2024-02-25 ENCOUNTER — Ambulatory Visit
Admission: RE | Admit: 2024-02-25 | Discharge: 2024-02-25 | Disposition: A | Discharge status: Home / Self Care | Source: Ambulatory Visit | Attending: Specialist | Admitting: Specialist

## 2024-02-25 DIAGNOSIS — J449 Chronic obstructive pulmonary disease, unspecified: Secondary | ICD-10-CM | POA: Insufficient documentation

## 2024-02-25 DIAGNOSIS — Z87891 Personal history of nicotine dependence: Secondary | ICD-10-CM | POA: Insufficient documentation

## 2024-03-04 DIAGNOSIS — M5416 Radiculopathy, lumbar region: Secondary | ICD-10-CM | POA: Diagnosis not present

## 2024-03-04 DIAGNOSIS — M48062 Spinal stenosis, lumbar region with neurogenic claudication: Secondary | ICD-10-CM | POA: Diagnosis not present

## 2024-03-06 ENCOUNTER — Other Ambulatory Visit: Payer: Self-pay | Admitting: Internal Medicine

## 2024-03-17 ENCOUNTER — Other Ambulatory Visit: Payer: Self-pay | Admitting: Internal Medicine

## 2024-03-23 DIAGNOSIS — M5416 Radiculopathy, lumbar region: Secondary | ICD-10-CM | POA: Diagnosis not present

## 2024-03-23 DIAGNOSIS — M48062 Spinal stenosis, lumbar region with neurogenic claudication: Secondary | ICD-10-CM | POA: Diagnosis not present

## 2024-03-31 ENCOUNTER — Other Ambulatory Visit: Payer: Self-pay | Admitting: Cardiology

## 2024-04-13 ENCOUNTER — Other Ambulatory Visit: Payer: Self-pay | Admitting: Internal Medicine

## 2024-04-13 DIAGNOSIS — M5416 Radiculopathy, lumbar region: Secondary | ICD-10-CM | POA: Diagnosis not present

## 2024-04-13 DIAGNOSIS — M48062 Spinal stenosis, lumbar region with neurogenic claudication: Secondary | ICD-10-CM | POA: Diagnosis not present

## 2024-04-30 ENCOUNTER — Other Ambulatory Visit: Payer: Self-pay | Admitting: Cardiology

## 2024-05-13 ENCOUNTER — Other Ambulatory Visit: Payer: Self-pay | Admitting: Cardiology

## 2024-05-18 NOTE — Progress Notes (Signed)
 Kristin Greer                                          MRN: [id number]   05/18/2024   The [facility] Quality Team Specialist reviewed this patient medical record for the purposes of chart review for care gap closure. The following were reviewed: chart review for care gap closure-kidney health evaluation for diabetes:eGFR  and uACR.    [facility] Quality Team

## 2024-05-29 ENCOUNTER — Other Ambulatory Visit: Payer: Self-pay | Admitting: Internal Medicine

## 2024-06-09 ENCOUNTER — Other Ambulatory Visit: Payer: Self-pay | Admitting: Cardiology

## 2024-06-09 ENCOUNTER — Other Ambulatory Visit: Payer: Self-pay | Admitting: Internal Medicine

## 2024-06-17 DIAGNOSIS — J449 Chronic obstructive pulmonary disease, unspecified: Secondary | ICD-10-CM | POA: Insufficient documentation

## 2024-06-22 ENCOUNTER — Ambulatory Visit: Payer: Self-pay | Admitting: Cardiology

## 2024-06-22 ENCOUNTER — Other Ambulatory Visit: Payer: Self-pay | Admitting: Cardiology

## 2024-06-22 ENCOUNTER — Ambulatory Visit: Admitting: Cardiology

## 2024-06-22 ENCOUNTER — Encounter: Payer: Self-pay | Admitting: Cardiology

## 2024-06-22 VITALS — BP 120/70 | HR 73 | Ht 65.0 in | Wt 151.8 lb

## 2024-06-22 DIAGNOSIS — E1165 Type 2 diabetes mellitus with hyperglycemia: Secondary | ICD-10-CM | POA: Diagnosis not present

## 2024-06-22 DIAGNOSIS — N1832 Chronic kidney disease, stage 3b: Secondary | ICD-10-CM

## 2024-06-22 DIAGNOSIS — E1159 Type 2 diabetes mellitus with other circulatory complications: Secondary | ICD-10-CM | POA: Diagnosis not present

## 2024-06-22 DIAGNOSIS — I152 Hypertension secondary to endocrine disorders: Secondary | ICD-10-CM

## 2024-06-22 DIAGNOSIS — E039 Hypothyroidism, unspecified: Secondary | ICD-10-CM

## 2024-06-22 DIAGNOSIS — E782 Mixed hyperlipidemia: Secondary | ICD-10-CM | POA: Diagnosis not present

## 2024-06-22 DIAGNOSIS — E1169 Type 2 diabetes mellitus with other specified complication: Secondary | ICD-10-CM | POA: Insufficient documentation

## 2024-06-22 LAB — POCT UA - MICROALBUMIN
Albumin/Creatinine Ratio, Urine, POC: <30
Creatinine, POC: 100 mg/dL
Microalbumin Ur, POC: 10 mg/L

## 2024-06-22 NOTE — Progress Notes (Signed)
 Established Patient Office Visit  Subjective:  Patient ID: Kristin Greer, female    DOB: Apr 29, 1956  Age: 68 y.o. MRN: [id number]  Chief Complaint  Patient presents with   Follow-up    4 month lab results, Uacr    Patient in office for 4 month follow up, discuss recent lab results. Patient doing well, no complaints today. Patient taking and tolerating medications.  Did not have fasting lab work done. Will do today. Patient scheduled for eye exam tomorrow. Due for urine micro, will do today. Blood pressure well controlled.  Continue current medications.     No other concerns at this time.   Past Medical History:  Diagnosis Date   Anxiety    Arthritis    Bursitis    Constipation    Fibroids    Gout    Hemorrhoids    Hyperlipidemia    Hypertension    Hypothyroid    Migraine    Wears dentures    full upper and lower    Past Surgical History:  Procedure Laterality Date   CATARACT EXTRACTION W/PHACO Left 07/07/2020   Procedure: CATARACT EXTRACTION PHACO AND INTRAOCULAR LENS PLACEMENT (IOC) LEFT DIABETIC 4.12 00:34.6;  Surgeon: Jaye Fallow, MD;  Location: [facility];  Service: Ophthalmology;  Laterality: Left;  Diabetic - oral meds   CATARACT EXTRACTION W/PHACO Right 08/02/2020   Procedure: CATARACT EXTRACTION PHACO AND INTRAOCULAR LENS PLACEMENT (IOC) RIGHT DIABETIC 4.25 00:34.5;  Surgeon: Jaye Fallow, MD;  Location: [facility];  Service: Ophthalmology;  Laterality: Right;   DG TEETH FULL     all teeth pulled has partials   LAPAROSCOPIC SUPRACERVICAL HYSTERECTOMY  2012   turmors removed     one in [employer] yrs ago; 5-6 removed 2015 [facility]    Social History   Socioeconomic History   Marital status: Divorced    Spouse name: Not on file   Number of children: Not on file   Years of education: Not on file   Highest education level: Not on file  Occupational History   Not on file  Tobacco Use   Smoking status: Former    Current  packs/day: 0.00    Types: Cigarettes    Quit date: 09/28/2019    Years since quitting: 4.7   Smokeless tobacco: Never  Vaping Use   Vaping status: Former  Substance and Sexual Activity   Alcohol use: Yes    Comment: rarely   Drug use: Never   Sexual activity: Not Currently    Birth control/protection: Post-menopausal  Other Topics Concern   Not on file  Social History Narrative   Not on file   Social Drivers of Health   Financial Resource Strain: Medium Risk (01/09/2024)   Received from [facility]   Overall Financial Resource Strain (CARDIA)    Difficulty of Paying Living Expenses: Somewhat hard  Food Insecurity: Food Insecurity Present (01/09/2024)   Received from [facility]   Hunger Vital Sign    Within the past 12 months, you worried that your food would run out before you got the money to buy more.: Often true    Within the past 12 months, the food you bought just didn't last and you didn't have money to get more.: Often true  Transportation Needs: No Transportation Needs (01/09/2024)   Received from [facility] - Transportation    In the past 12 months, has lack of transportation kept you from medical  appointments or from getting medications?: No    Lack of Transportation (Non-Medical): No  Physical Activity: Not on file  Stress: Not on file  Social Connections: Not on file  Intimate Partner Violence: Not on file    Family History  Problem Relation Age of Onset   Breast cancer Sister 32   Lupus Sister     Allergies  Allergen Reactions   Varenicline     Note: patient sleepwalks    Outpatient Medications Prior to Visit  Medication Sig   acetaminophen  (TYLENOL ) 650 MG CR tablet Take 1,300 mg by mouth every 8 (eight) hours as needed for pain.   albuterol (VENTOLIN HFA) 108 (90 Base) MCG/ACT inhaler INHALE 1 PUFF BY MOUTH EVERY 6 HOURS AS NEEDED FOR SHORTNESS OF BREATH AND OR WHEEZING   amLODipine   (NORVASC ) 5 MG tablet Take 1 tablet (5 mg total) by mouth daily.   ASPIRIN LOW DOSE 81 MG tablet TAKE 1 TABLET BY MOUTH DAILY   aspirin-acetaminophen -caffeine (EXCEDRIN MIGRAINE) 250-250-65 MG tablet Take 2 tablets by mouth every 6 (six) hours as needed for headache.   atorvastatin (LIPITOR) 40 MG tablet TAKE 1 TABLET BY MOUTH NIGHTLY AT BEDTIME FOR CHOLESTEROL (NOTE INCREASED DOSAGE)   benztropine (COGENTIN) 1 MG tablet Take 1 mg by mouth 2 (two) times daily.   CALCIUM 600 1500 (600 Ca) MG TABS tablet TAKE 1 TABLET BY MOUTH DAILY   Calcium Carbonate-Vit D-Min (CALTRATE 600+D PLUS MINERALS) 600-800 MG-UNIT CHEW Caltrate 600+D Plus Minerals 600-800 MG-UNIT Oral Tablet Chewable QTY: 0 tablet Days: 0 Refills: 0  Written: 06/04/18 Patient Instructions: qd   cetirizine (ZYRTEC) 10 MG tablet TAKE 1 TABLET BY MOUTH AT BEDTIME FOR ALLERGIES   cloNIDine  (CATAPRES ) 0.1 MG tablet Take 1 tablet (0.1 mg total) by mouth daily.   fluPHENAZine (PROLIXIN) 10 MG tablet Take 10 mg by mouth 3 (three) times daily.   fluticasone  (FLONASE ) 50 MCG/ACT nasal spray PLACE 2 SPRAYS INTO BOTH NOSTRILS DAILY   gabapentin (NEURONTIN) 100 MG capsule TAKE 1 CAPSULE BY MOUTH IN THE MORNING AND 1 CAPSULE BY MOUTH AT BEDTIME FOR TINGLING SENSATION   glipiZIDE  (GLUCOTROL  XL) 10 MG 24 hr tablet TAKE 1 TABLET BY MOUTH DAILY WITH BREAKFAST   HYDROXYZINE PAMOATE PO Take 25 mg by mouth 2 (two) times daily.   levothyroxine (SYNTHROID) 50 MCG tablet TAKE 1 TABLET BY  MOUTH EVERY MORNING ONAN EMPTY STOMACH 1 HOUR PRIOR TO BREAKFAST   losartan  (COZAAR ) 100 MG tablet Take 1 tablet (100 mg total) by mouth daily.   montelukast (SINGULAIR) 10 MG tablet TAKE 1 TABLET BY MOUTH DAILY FOR (NASAL CONGESTION)   naproxen  (NAPROSYN ) 500 MG tablet TAKE 1 TABLET BY MOUTH 2 TIMES DAILY WITH A MEAL   nicotine polacrilex (NICORETTE) 4 MG gum CHEW 1 GUM PIECE FOR NICOTINE CRAVING ASNEEDED (UP TO 6 PIECES PER DAY)   OLANZapine (ZYPREXA) 20 MG tablet Take 20 mg  by mouth at bedtime.   Omega-3 Fatty Acids (SM FISH OIL) 1000 MG CAPS Take 1 capsule by mouth 2 (two) times daily.   senna (SENOKOT) 8.6 MG tablet Take 1 tablet by mouth daily.   STIOLTO RESPIMAT 2.5-2.5 MCG/ACT AERS SMARTSIG:2 Puff(s) Via Inhaler Daily   tirzepatide  (MOUNJARO ) 7.5 MG/0.5ML Pen Inject 7.5 mg into the skin once a week.   traZODone (DESYREL) 50 MG tablet Take 1 tablet by mouth 2 (two) times daily.   Vitamin D, Ergocalciferol, (DRISDOL) 1.25 MG (50000 UNIT) CAPS capsule TAKE 1 CAPSULE BY MOUTH WEEKLY FOR  LOW VITAMIN D   No facility-administered medications prior to visit.    Review of Systems  Constitutional: Negative.   HENT: Negative.    Eyes: Negative.   Respiratory: Negative.  Negative for shortness of breath.   Cardiovascular: Negative.  Negative for chest pain.  Gastrointestinal: Negative.  Negative for abdominal pain, constipation and diarrhea.  Genitourinary: Negative.   Musculoskeletal:  Negative for joint pain and myalgias.  Skin: Negative.   Neurological: Negative.  Negative for dizziness and headaches.  Endo/Heme/Allergies: Negative.   All other systems reviewed and are negative.      Objective:   BP 120/70   Pulse 73   Ht 5' 5 (1.651 m)   Wt 151 lb 12.8 oz (68.9 kg)   SpO2 96%   BMI 25.26 kg/m   Vitals:   06/22/24 1309  BP: 120/70  Pulse: 73  Height: 5' 5 (1.651 m)  Weight: 151 lb 12.8 oz (68.9 kg)  SpO2: 96%  BMI (Calculated): 25.26    Physical Exam Vitals and nursing note reviewed.  Constitutional:      Appearance: Normal appearance. She is normal weight.  HENT:     Head: Normocephalic and atraumatic.     Nose: Nose normal.     Mouth/Throat:     Mouth: Mucous membranes are moist.  Eyes:     Extraocular Movements: Extraocular movements intact.     Conjunctiva/sclera: Conjunctivae normal.     Pupils: Pupils are equal, round, and reactive to light.  Cardiovascular:     Rate and Rhythm: Normal rate and regular rhythm.      Pulses: Normal pulses.     Heart sounds: Normal heart sounds.  Pulmonary:     Effort: Pulmonary effort is normal.     Breath sounds: Normal breath sounds.  Abdominal:     General: Abdomen is flat. Bowel sounds are normal.     Palpations: Abdomen is soft.  Musculoskeletal:        General: Normal range of motion.     Cervical back: Normal range of motion.  Skin:    General: Skin is warm and dry.  Neurological:     General: No focal deficit present.     Mental Status: She is alert and oriented to person, place, and time.  Psychiatric:        Mood and Affect: Mood normal.        Behavior: Behavior normal.        Thought Content: Thought content normal.        Judgment: Judgment normal.      No results found for any visits on 06/22/24.  No results found for this or any previous visit (from the past 2160 hours).    Assessment & Plan:  Fasting lab work today Urine micro today Continue current medications  Problem List Items Addressed This Visit       Cardiovascular and Mediastinum   Hypertension associated with diabetes (HCC)   Relevant Orders   POCT Urine Albumin/Creatinine with ratio [[id number]]   CMP14+EGFR     Endocrine   Hypothyroidism, adult   Relevant Orders   TSH   Combined hyperlipidemia associated with type 2 diabetes mellitus (HCC)   Relevant Orders   Lipid Profile   Type 2 diabetes mellitus with hyperglycemia, without long-term current use of insulin (HCC) - Primary   Relevant Orders   Hemoglobin A1c     Genitourinary   Stage 3b chronic kidney disease (HCC)    Return in about 4  months (around 10/20/2024) for fasting lab work prior.   Total time spent: 25 minutes. This time includes review of previous notes and results and patient face to face interaction during today's visit.    Jeoffrey Pollen, NP  06/22/2024   This document may have been prepared by Dragon Voice Recognition software and as such may include unintentional dictation errors.

## 2024-06-23 LAB — HEMOGLOBIN A1C
Est. average glucose Bld gHb Est-mCnc: 117 mg/dL
Hgb A1c MFr Bld: 5.7 % — ABNORMAL HIGH (ref 4.8–5.6)

## 2024-06-23 LAB — CMP14+EGFR
ALT: 11 IU/L (ref 0–32)
AST: 18 IU/L (ref 0–40)
Albumin: 4.3 g/dL (ref 3.9–4.9)
Alkaline Phosphatase: 73 IU/L (ref 49–135)
BUN/Creatinine Ratio: 7 — ABNORMAL LOW (ref 12–28)
BUN: 9 mg/dL (ref 8–27)
Bilirubin Total: 0.8 mg/dL (ref 0.0–1.2)
CO2: 24 mmol/L (ref 20–29)
Calcium: 9.7 mg/dL (ref 8.7–10.3)
Chloride: 96 mmol/L (ref 96–106)
Creatinine, Ser: 1.26 mg/dL — ABNORMAL HIGH (ref 0.57–1.00)
Globulin, Total: 2.7 g/dL (ref 1.5–4.5)
Glucose: 86 mg/dL (ref 70–99)
Potassium: 4.3 mmol/L (ref 3.5–5.2)
Sodium: 132 mmol/L — ABNORMAL LOW (ref 134–144)
Total Protein: 7 g/dL (ref 6.0–8.5)
eGFR: 46 mL/min/1.73 — ABNORMAL LOW (ref >59)

## 2024-06-23 LAB — LIPID PANEL
Chol/HDL Ratio: 2.2 ratio (ref 0.0–4.4)
Cholesterol, Total: 115 mg/dL (ref 100–199)
HDL: 53 mg/dL (ref >39)
LDL Chol Calc (NIH): 49 mg/dL (ref 0–99)
Triglycerides: 59 mg/dL (ref 0–149)
VLDL Cholesterol Cal: 13 mg/dL (ref 5–40)

## 2024-06-23 LAB — TSH: TSH: 1.17 u[IU]/mL (ref 0.450–4.500)

## 2024-06-23 NOTE — Progress Notes (Signed)
Pt informed

## 2024-06-26 ENCOUNTER — Other Ambulatory Visit: Payer: Self-pay | Admitting: Cardiology

## 2024-07-07 ENCOUNTER — Other Ambulatory Visit: Payer: Self-pay | Admitting: Cardiology

## 2024-07-07 DIAGNOSIS — E119 Type 2 diabetes mellitus without complications: Secondary | ICD-10-CM

## 2024-07-08 ENCOUNTER — Other Ambulatory Visit: Payer: Self-pay | Admitting: Cardiology

## 2024-08-04 ENCOUNTER — Other Ambulatory Visit: Payer: Self-pay | Admitting: Cardiology

## 2024-08-10 ENCOUNTER — Other Ambulatory Visit: Payer: Self-pay | Admitting: Cardiology

## 2024-08-19 ENCOUNTER — Other Ambulatory Visit: Payer: Self-pay | Admitting: Cardiology

## 2024-08-20 ENCOUNTER — Other Ambulatory Visit: Payer: Self-pay | Admitting: Cardiology

## 2024-09-01 ENCOUNTER — Other Ambulatory Visit: Payer: Self-pay | Admitting: Cardiology

## 2024-09-04 ENCOUNTER — Other Ambulatory Visit: Payer: Self-pay

## 2024-09-04 MED ORDER — NICOTINE POLACRILEX 4 MG MT GUM: 4.0000 mg | CHEWING_GUM | OROMUCOSAL | 0 refills | Status: AC | PRN

## 2024-09-04 MED ORDER — MOUNJARO 7.5 MG/0.5ML ~~LOC~~ SOAJ: 7.5000 mg | SUBCUTANEOUS | 4 refills | Status: AC

## 2024-10-20 ENCOUNTER — Ambulatory Visit: Admitting: Cardiology
